# Patient Record
Sex: Female | Born: 1950 | Race: White | Hispanic: Yes | Marital: Single | Smoking: Never smoker
Health system: Northeastern US, Community
[De-identification: ages and names within clinical notes are randomized; demographics above are authoritative.]

## PROBLEM LIST (undated history)

## (undated) DIAGNOSIS — R030 Elevated blood-pressure reading, without diagnosis of hypertension: Secondary | ICD-10-CM

## (undated) DIAGNOSIS — E785 Hyperlipidemia, unspecified: Secondary | ICD-10-CM

## (undated) DIAGNOSIS — R87810 Cervical high risk human papillomavirus (HPV) DNA test positive: Secondary | ICD-10-CM

## (undated) DIAGNOSIS — H524 Presbyopia: Secondary | ICD-10-CM

## (undated) DIAGNOSIS — I059 Rheumatic mitral valve disease, unspecified: Secondary | ICD-10-CM

## (undated) DIAGNOSIS — N200 Calculus of kidney: Secondary | ICD-10-CM

## (undated) HISTORY — DX: Presbyopia: H52.4

## (undated) HISTORY — PX: BREAST REDUCTION SURGERY: SHX8

## (undated) HISTORY — DX: Rheumatic mitral valve disease, unspecified: I05.9

## (undated) HISTORY — DX: Elevated blood-pressure reading, without diagnosis of hypertension: R03.0

## (undated) HISTORY — DX: Cervical high risk human papillomavirus (HPV) DNA test positive: R87.810

## (undated) HISTORY — DX: Calculus of kidney: N20.0

## (undated) HISTORY — DX: Hyperlipidemia, unspecified: E78.5

---

## 1993-03-22 HISTORY — PX: NIPPLE/AREOLA RECONSTRUCTION: REP27

## 1998-08-19 ENCOUNTER — Ambulatory Visit (HOSPITAL_BASED_OUTPATIENT_CLINIC_OR_DEPARTMENT_OTHER): Payer: Self-pay | Admitting: Geriatric Medicine

## 1999-03-05 ENCOUNTER — Ambulatory Visit: Payer: Self-pay | Admitting: Family Medicine

## 1999-03-05 LAB — CHG CYTP SLIDES CERV/VAG MNL SCRN PHYSICIAN SUPV

## 1999-03-06 ENCOUNTER — Other Ambulatory Visit: Payer: Self-pay | Admitting: Family Medicine

## 1999-04-22 ENCOUNTER — Ambulatory Visit: Payer: Self-pay | Admitting: Family Medicine

## 1999-05-05 ENCOUNTER — Ambulatory Visit: Payer: Self-pay

## 1999-05-12 ENCOUNTER — Ambulatory Visit: Payer: Self-pay

## 1999-08-04 ENCOUNTER — Other Ambulatory Visit: Payer: Self-pay

## 1999-08-10 ENCOUNTER — Ambulatory Visit: Payer: Self-pay | Admitting: Family Medicine

## 1999-11-05 ENCOUNTER — Ambulatory Visit: Payer: Self-pay | Admitting: Family Medicine

## 1999-11-05 LAB — CHG GONADOTROPIN FOLLICLE STIMULATING HORMONE: FOLLICLE STIMULATING HORMONE: 94.37 m[IU]/mL

## 1999-11-05 LAB — THYROID SCREEN TSH REFLEX FT4: THYROID SCREEN TSH REFLEX FT4: 1.2 u[IU]/mL (ref 0.34–5.60)

## 1999-11-24 ENCOUNTER — Ambulatory Visit: Payer: Self-pay | Admitting: Family Medicine

## 1999-11-24 LAB — CHG SEDIMENTATION RATE RBC NON-AUTOMATED: RBC SEDIMENTATION RATE: 35 MM/HR — ABNORMAL HIGH (ref 0–15)

## 1999-11-24 LAB — COMPLETE BLOOD COUNT
HEMATOCRIT: 36.1 % — ABNORMAL LOW (ref 37.0–47.0)
HEMOGLOBIN: 12.8 g/dL (ref 12.0–16.0)
MEAN CORP HGB CONC: 35.5 g/dL (ref 32.0–36.0)
MEAN CORPUSCULAR HGB: 31.3 pg — ABNORMAL HIGH (ref 27.0–31.0)
MEAN CORPUSCULAR VOL: 88 fL (ref 81.0–99.0)
RBC DISTRIBUTION WIDTH: 11.8 % (ref 11.5–14.3)
RED BLOOD CELL COUNT: 4.1 MIL/uL — ABNORMAL LOW (ref 4.20–5.40)
WHITE BLOOD CELL COUNT: 9.6 10*3/uL (ref 4.8–10.8)

## 1999-11-24 LAB — HOLD RED TOP TUBE

## 1999-11-25 ENCOUNTER — Other Ambulatory Visit (HOSPITAL_BASED_OUTPATIENT_CLINIC_OR_DEPARTMENT_OTHER): Payer: Self-pay | Admitting: Family Medicine

## 1999-11-27 ENCOUNTER — Other Ambulatory Visit (HOSPITAL_BASED_OUTPATIENT_CLINIC_OR_DEPARTMENT_OTHER): Payer: Self-pay | Admitting: Family Medicine

## 2000-01-07 ENCOUNTER — Ambulatory Visit (HOSPITAL_BASED_OUTPATIENT_CLINIC_OR_DEPARTMENT_OTHER): Payer: Self-pay

## 2000-03-03 ENCOUNTER — Ambulatory Visit: Payer: Self-pay | Admitting: Family Medicine

## 2000-03-03 LAB — CHG CYTP SLIDES CERV/VAG MNL SCRN PHYSICIAN SUPV

## 2000-03-03 LAB — IADNA CHLAMYDIA TRACHOMATIS DIRECT PROBE TQ: GENPROBE CHLAMYDIA: NEGATIVE

## 2000-03-03 LAB — IADNA NEISSERIA GONORRHOEAE DIRECT PROBE TQ: GENPROBE GC: NEGATIVE

## 2000-03-04 ENCOUNTER — Other Ambulatory Visit: Payer: Self-pay | Admitting: Family Medicine

## 2000-03-04 LAB — COMPLETE BLOOD COUNT
HEMATOCRIT: 38.9 % (ref 37.0–47.0)
HEMOGLOBIN: 13.5 g/dL (ref 12.0–16.0)
MEAN CORP HGB CONC: 34.7 g/dL (ref 32.0–36.0)
MEAN CORPUSCULAR HGB: 31.1 pg — ABNORMAL HIGH (ref 27.0–31.0)
MEAN CORPUSCULAR VOL: 89.8 fL (ref 81.0–99.0)
RBC DISTRIBUTION WIDTH: 11.8 % (ref 11.5–14.3)
RED BLOOD CELL COUNT: 4.33 MIL/uL (ref 4.20–5.40)
WHITE BLOOD CELL COUNT: 7.3 10*3/uL (ref 4.8–10.8)

## 2000-03-04 LAB — CHG LIPID PANEL
Cholesterol: 270 mg/dl — ABNORMAL HIGH (ref ?–200)
HIGH DENSITY LIPOPROTEIN: 58 mg/dl (ref 35–95)
LOW DENSITY LIPOPROTEIN DIRECT: 190 mg/dl — ABNORMAL HIGH (ref ?–130)
RISK FACTOR: 4.7 — ABNORMAL HIGH (ref ?–4.4)
TRIGLYCERIDES: 119 mg/dl (ref 30–200)

## 2000-03-04 LAB — THYROID SCREEN TSH REFLEX FT4: THYROID SCREEN TSH REFLEX FT4: 1.28 u[IU]/mL (ref 0.34–5.60)

## 2000-03-04 LAB — GLUCOSE FASTING: GLUCOSE FASTING: 86 mg/dl (ref 65–110)

## 2000-05-11 ENCOUNTER — Ambulatory Visit: Payer: Self-pay | Admitting: Family Medicine

## 2000-10-14 ENCOUNTER — Ambulatory Visit: Payer: Self-pay | Admitting: Family Medicine

## 2001-03-10 ENCOUNTER — Ambulatory Visit: Payer: Self-pay | Admitting: Family Medicine

## 2001-03-10 LAB — IADNA NEISSERIA GONORRHOEAE DIRECT PROBE TQ: GENPROBE GC: NEGATIVE

## 2001-03-10 LAB — IADNA CHLAMYDIA TRACHOMATIS DIRECT PROBE TQ: GENPROBE CHLAMYDIA: NEGATIVE

## 2001-03-10 LAB — CYTOPATH, C/V, THIN LAYER

## 2001-03-10 LAB — URINE CULTURE/COLONY COUNT: URINE CULTURE/COLONY COUNT: NO GROWTH

## 2001-04-18 ENCOUNTER — Ambulatory Visit: Payer: Self-pay | Admitting: Family Medicine

## 2001-07-14 ENCOUNTER — Ambulatory Visit (HOSPITAL_BASED_OUTPATIENT_CLINIC_OR_DEPARTMENT_OTHER): Payer: Self-pay | Admitting: Gastroenterology

## 2001-07-28 ENCOUNTER — Encounter (HOSPITAL_BASED_OUTPATIENT_CLINIC_OR_DEPARTMENT_OTHER): Payer: Self-pay | Admitting: Gastroenterology

## 2001-07-28 LAB — ~~LOC~~-OPERATIVE REPORT

## 2001-08-03 ENCOUNTER — Other Ambulatory Visit: Payer: Self-pay | Admitting: Family Medicine

## 2001-08-04 LAB — MA SCREENING MAMMO BILATERAL WITH CAD

## 2001-08-18 ENCOUNTER — Ambulatory Visit (HOSPITAL_BASED_OUTPATIENT_CLINIC_OR_DEPARTMENT_OTHER): Payer: Self-pay | Admitting: Gastroenterology

## 2002-03-05 ENCOUNTER — Ambulatory Visit: Payer: Self-pay | Admitting: Family Medicine

## 2002-03-05 LAB — GLUCOSE FASTING: GLUCOSE FASTING: 81 mg/dl (ref 65–110)

## 2002-03-05 LAB — CHG LIPID PANEL
Cholesterol: 241 mg/dl — ABNORMAL HIGH (ref ?–200)
HIGH DENSITY LIPOPROTEIN: 69 mg/dl (ref 35–95)
LOW DENSITY LIPOPROTEIN DIRECT: 164 mg/dl — ABNORMAL HIGH (ref ?–130)
RISK FACTOR: 3.5 (ref ?–4.4)
TRIGLYCERIDES: 55 mg/dl (ref 30–200)

## 2002-03-05 LAB — CYTOPATH, C/V, THIN LAYER

## 2002-03-05 LAB — THYROID SCREEN TSH REFLEX FT4: THYROID SCREEN TSH REFLEX FT4: 0.68 u[IU]/mL (ref 0.34–5.60)

## 2002-03-05 LAB — HEMATOCRIT: HEMATOCRIT: 40.1 % (ref 37.0–47.0)

## 2002-04-12 ENCOUNTER — Other Ambulatory Visit (HOSPITAL_BASED_OUTPATIENT_CLINIC_OR_DEPARTMENT_OTHER): Payer: Self-pay | Admitting: Family Medicine

## 2002-04-19 ENCOUNTER — Encounter: Payer: Self-pay | Admitting: Family Medicine

## 2002-04-27 LAB — BLOOD COUNT COMPLETE AUTOMATED
HEMATOCRIT: 39.4 % (ref 37.0–47.0)
HEMOGLOBIN: 13.5 g/dL (ref 12.0–16.0)
MEAN CORP HGB CONC: 34.2 g/dL (ref 32.0–36.0)
MEAN CORPUSCULAR HGB: 30 pg (ref 27.0–31.0)
MEAN CORPUSCULAR VOL: 87.7 fL (ref 81.0–99.0)
MEAN PLATELET VOLUME: 9.4 fL (ref 6.4–10.8)
PLATELET COUNT: 252 10*3/uL (ref 150–400)
RBC DISTRIBUTION WIDTH: 12.2 % (ref 11.5–14.3)
RED BLOOD CELL COUNT: 4.49 MIL/uL (ref 4.20–5.40)
WHITE BLOOD CELL COUNT: 8.3 10*3/uL (ref 4.8–10.8)

## 2002-04-27 LAB — PROTHROMBIN TIME
INR: 0.9 — ABNORMAL LOW (ref 2.0–3.5)
PROTHROMBIN TIME: 11.8 SECONDS (ref 11.5–13.5)

## 2002-04-27 LAB — APTT: APTT: 28 SECONDS (ref 22.0–32.8)

## 2002-04-28 LAB — BASIC METABOLIC PANEL
BUN (UREA NITROGEN): 14 mg/dl (ref 10–20)
CALCIUM: 8.7 mg/dl (ref 8.5–10.5)
CARBON DIOXIDE: 29 mEQ/L (ref 22–32)
CHLORIDE: 106 mEQ/L (ref 98–110)
CREATININE: 0.9 mg/dl (ref 0.8–1.2)
Glucose Random: 90 mg/dl (ref 65–160)
POTASSIUM: 4.6 mEQ/L (ref 3.5–5.0)
SODIUM: 138 mEQ/L (ref 135–145)

## 2002-04-28 LAB — CHG HEPATIC FUNCTION PANEL
ALANINE AMINOTRANSFERASE: 28 IU/L (ref 3–36)
ALBUMIN: 3.7 g/dl (ref 3.5–5.0)
ALKALINE PHOSPHATASE: 55 IU/L (ref 50–136)
ASPARTATE AMINOTRANSFERASE: 34 U/L — ABNORMAL HIGH (ref 7–29)
BILIRUBIN DIRECT: 0.1 mg/dl (ref 0–0.36)
BILIRUBIN TOTAL: 0.4 mg/dl (ref 0.15–1.0)
TOTAL PROTEIN: 7.3 g/dl (ref 6.2–8.5)

## 2002-04-28 LAB — CHG LIPID PANEL
Cholesterol: 271 mg/dl — ABNORMAL HIGH (ref ?–200)
HIGH DENSITY LIPOPROTEIN: 46 mg/dl (ref 35–95)
LOW DENSITY LIPOPROTEIN DIRECT: 183 mg/dl — ABNORMAL HIGH (ref ?–130)
RISK FACTOR: 5.9 — ABNORMAL HIGH (ref ?–4.4)
TRIGLYCERIDES: 280 mg/dl — ABNORMAL HIGH (ref 30–200)

## 2002-04-28 LAB — BLOOD COUNT COMPLETE AUTOMATED
HEMATOCRIT: 41.4 % (ref 37.0–47.0)
HEMOGLOBIN: 14.3 g/dL (ref 12.0–16.0)
MEAN CORP HGB CONC: 34.6 g/dL (ref 32.0–36.0)
MEAN CORPUSCULAR HGB: 30.7 pg (ref 27.0–31.0)
MEAN CORPUSCULAR VOL: 88.6 fL (ref 81.0–99.0)
MEAN PLATELET VOLUME: 9.1 fL (ref 8.0–12.0)
PLATELET COUNT: 176 10*3/uL (ref 150–400)
RBC DISTRIBUTION WIDTH: 12.4 % (ref 11.5–14.3)
RED BLOOD CELL COUNT: 4.67 MIL/uL (ref 4.20–5.40)
WHITE BLOOD CELL COUNT: 2.5 10*3/uL — ABNORMAL LOW (ref 4.8–10.8)

## 2002-04-28 LAB — DIFFERENTIAL WBC COUNT
ATYPICAL LYMPHOCYTES %: 10 % (ref 0–12)
BAND NEUTROPHILS %: 3 % (ref 0–8)
LYMPHOCYTES %: 39 % (ref 12–40)
MONOCYTES %: 8 % (ref 1–11)
PLATELET ESTIMATE: NORMAL
POLYMORPHONUCLEAR (SEGS) %: 40 % — ABNORMAL LOW (ref 41–77)
RED BLOOD CELL MORPHOLOGY: NORMAL

## 2002-04-28 LAB — COMPLETE BLOOD COUNT
HEMATOCRIT: 38.9 % (ref 37.0–47.0)
HEMOGLOBIN: 13.2 g/dL (ref 12.0–16.0)
MEAN CORP HGB CONC: 34.1 g/dL (ref 32.0–36.0)
MEAN CORPUSCULAR HGB: 29.9 pg (ref 27.0–31.0)
MEAN CORPUSCULAR VOL: 87.8 fL (ref 81.0–99.0)
RBC DISTRIBUTION WIDTH: 12.2 % (ref 11.5–14.3)
RED BLOOD CELL COUNT: 4.42 MIL/uL (ref 4.20–5.40)
WHITE BLOOD CELL COUNT: 7.2 10*3/uL (ref 4.8–10.8)

## 2002-04-28 LAB — GLUCOSE FASTING: GLUCOSE FASTING: 88 mg/dl (ref 65–110)

## 2002-04-28 LAB — CHG SEDIMENTATION RATE RBC NON-AUTOMATED: RBC SEDIMENTATION RATE: 30 MM/HR — ABNORMAL HIGH (ref 0–15)

## 2002-04-28 LAB — CHG GONADOTROPIN FOLLICLE STIMULATING HORMONE: FOLLICLE STIMULATING HORMONE: 120.7 m[IU]/mL

## 2002-04-28 LAB — RPR: RPR: NONREACTIVE

## 2002-04-28 LAB — HOLD RED TOP TUBE

## 2002-05-04 ENCOUNTER — Encounter (HOSPITAL_BASED_OUTPATIENT_CLINIC_OR_DEPARTMENT_OTHER): Payer: Self-pay | Admitting: Dermatology

## 2002-05-14 LAB — CARDIAC STRESS TEST: EXERCISE/TREADMILL

## 2002-07-19 ENCOUNTER — Ambulatory Visit: Payer: Self-pay | Admitting: Family Medicine

## 2002-10-03 ENCOUNTER — Encounter (HOSPITAL_BASED_OUTPATIENT_CLINIC_OR_DEPARTMENT_OTHER): Payer: No Typology Code available for payment source | Admitting: Dermatology

## 2003-04-15 ENCOUNTER — Ambulatory Visit (HOSPITAL_BASED_OUTPATIENT_CLINIC_OR_DEPARTMENT_OTHER): Payer: Self-pay | Admitting: Occupational Medicine

## 2003-04-17 ENCOUNTER — Ambulatory Visit (HOSPITAL_BASED_OUTPATIENT_CLINIC_OR_DEPARTMENT_OTHER): Payer: Self-pay

## 2003-04-22 LAB — HEPATITIS B SURFACE ANTIBODY: HEPATITIS B SURFACE ANTIBODY: NEGATIVE

## 2004-03-27 ENCOUNTER — Ambulatory Visit (HOSPITAL_BASED_OUTPATIENT_CLINIC_OR_DEPARTMENT_OTHER): Payer: Self-pay

## 2004-04-08 ENCOUNTER — Ambulatory Visit (HOSPITAL_BASED_OUTPATIENT_CLINIC_OR_DEPARTMENT_OTHER): Payer: Self-pay | Admitting: Internal Medicine

## 2004-04-27 ENCOUNTER — Ambulatory Visit (HOSPITAL_BASED_OUTPATIENT_CLINIC_OR_DEPARTMENT_OTHER): Payer: Self-pay

## 2004-04-27 DIAGNOSIS — Z23 Encounter for immunization: Principal | ICD-10-CM

## 2004-06-18 ENCOUNTER — Ambulatory Visit (HOSPITAL_BASED_OUTPATIENT_CLINIC_OR_DEPARTMENT_OTHER): Payer: Charity | Admitting: Dermatology

## 2004-06-30 ENCOUNTER — Other Ambulatory Visit (HOSPITAL_BASED_OUTPATIENT_CLINIC_OR_DEPARTMENT_OTHER): Payer: Charity | Admitting: Lab

## 2004-07-01 ENCOUNTER — Ambulatory Visit (HOSPITAL_BASED_OUTPATIENT_CLINIC_OR_DEPARTMENT_OTHER): Payer: Self-pay

## 2004-07-01 ENCOUNTER — Ambulatory Visit (HOSPITAL_BASED_OUTPATIENT_CLINIC_OR_DEPARTMENT_OTHER): Payer: Charity

## 2004-07-01 DIAGNOSIS — Z111 Encounter for screening for respiratory tuberculosis: Secondary | ICD-10-CM

## 2004-07-01 DIAGNOSIS — Z139 Encounter for screening, unspecified: Principal | ICD-10-CM

## 2004-07-01 NOTE — Nursing Note (Signed)
>>   GESING, SUSAN     07/01/2004   5:09 pm  PPD 0.78ml ID lt forearm planted and pt agrees to rtc in 2 days for a PPD check.She also needs titers drawn for MMR and Varicella.Appt scheduled with dr. Tyler Deis on 08/10/04

## 2004-07-02 LAB — RUBEOLA IGG ANTIBODY: RUBEOLA IGG ANTIBODY: 1.8 — ABNORMAL HIGH (ref 0.0–0.8)

## 2004-07-02 LAB — MUMPS IGG ANTIBODY: MUMPS IGG ANTIBODY: 3.1 — ABNORMAL HIGH (ref 0.0–0.8)

## 2004-07-02 LAB — VARICELLA ZOSTER IGG ANTIBODY: VARICELLA ZOSTER IGG ANTIBODY: >3.2 — ABNORMAL HIGH (ref 0.0–0.8)

## 2004-07-02 LAB — RUBELLA IGG ANTIBODY: RUBELLA: IMMUNE

## 2004-07-03 ENCOUNTER — Ambulatory Visit (HOSPITAL_BASED_OUTPATIENT_CLINIC_OR_DEPARTMENT_OTHER): Payer: Charity

## 2004-07-03 ENCOUNTER — Ambulatory Visit (HOSPITAL_BASED_OUTPATIENT_CLINIC_OR_DEPARTMENT_OTHER): Payer: Self-pay

## 2004-07-03 DIAGNOSIS — Z23 Encounter for immunization: Principal | ICD-10-CM

## 2004-07-03 LAB — SKIN TEST TUBERCULOSIS INTRADERMAL: PPD: NEGATIVE

## 2004-07-03 NOTE — Nursing Note (Signed)
>>   MATOS, [facility]     07/03/2004   7:03 pm  Pt here for ppd reading . Reading was normal no inderation. Also needed td. Td given Vis sheet given . Titer results given.

## 2004-08-10 ENCOUNTER — Encounter (HOSPITAL_BASED_OUTPATIENT_CLINIC_OR_DEPARTMENT_OTHER): Payer: Self-pay | Admitting: Family Medicine

## 2004-08-10 ENCOUNTER — Ambulatory Visit (HOSPITAL_BASED_OUTPATIENT_CLINIC_OR_DEPARTMENT_OTHER): Payer: Charity | Admitting: Family Medicine

## 2004-08-10 ENCOUNTER — Ambulatory Visit (HOSPITAL_BASED_OUTPATIENT_CLINIC_OR_DEPARTMENT_OTHER): Payer: Self-pay | Admitting: Family Medicine

## 2004-08-10 VITALS — BP 130/78 | HR 78 | Temp 98.0°F | Resp 18 | Ht 62.5 in | Wt 114.0 lb

## 2004-08-10 DIAGNOSIS — E785 Hyperlipidemia, unspecified: Secondary | ICD-10-CM

## 2004-08-10 DIAGNOSIS — I059 Rheumatic mitral valve disease, unspecified: Secondary | ICD-10-CM | POA: Insufficient documentation

## 2004-08-10 DIAGNOSIS — Z0000 Encounter for general adult medical examination without abnormal findings: Principal | ICD-10-CM

## 2004-08-10 DIAGNOSIS — N200 Calculus of kidney: Secondary | ICD-10-CM | POA: Insufficient documentation

## 2004-08-10 DIAGNOSIS — E7800 Pure hypercholesterolemia, unspecified: Secondary | ICD-10-CM | POA: Insufficient documentation

## 2004-08-10 DIAGNOSIS — N952 Postmenopausal atrophic vaginitis: Secondary | ICD-10-CM

## 2004-08-10 HISTORY — DX: Rheumatic mitral valve disease, unspecified: I05.9

## 2004-08-10 HISTORY — DX: Hyperlipidemia, unspecified: E78.5

## 2004-08-10 HISTORY — DX: Calculus of kidney: N20.0

## 2004-08-10 LAB — CYTOPATH, C/V, THIN LAYER

## 2004-08-10 MED ORDER — CVS ENTERIC ASPIRIN 81 MG OR TBEC
DELAYED_RELEASE_TABLET | ORAL | Status: DC
Start: 2004-08-10 — End: 2006-07-08

## 2004-08-10 MED ORDER — ESTRACE 0.1 MG/GM VA CREA
TOPICAL_CREAM | VAGINAL | Status: DC
Start: 2004-08-10 — End: 2004-08-10

## 2004-08-10 MED ORDER — ESTROGENS, CONJUGATED 0.625 MG/GM VA CREA
TOPICAL_CREAM | VAGINAL | Status: DC
Start: 2004-08-10 — End: 2004-11-02

## 2004-08-10 NOTE — Progress Notes (Signed)
Chief Complaint:  Linda Rodriguez is a 54 year old female who presents for a physical exam. Patient also has other current concerns as detailed below.  I have reviewed the patient's medical history in detail and updated the computerized patient record.  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    Allergies: Propoxyphene, Diclofenac Potassium    Immunization History   Hep B,Adult dose 03/27/2004 04/27/2004    PPD 07/01/2004    TD 03/23/1994 07/03/2004   Past Medical History:   menopause 2001 08/10/2004    MITRAL VALVE DISORDER 08/10/2004    Comment: History of rheumatic fever Echo 9/01 mild    mitral regurgitation   CALCULUS OF KIDNEY 08/10/2004    HYPERLIPIDEMIA NEC/NOS 08/10/2004    Comment: LDL 194  Past Surgical History:   BREAST RECONSTRUCTION 1995    Comment: breast reduction  Social History   Marital Status: Single Spouse Name:    Years of Education: Number of children:     Occupational History  Occupation Clinical cytogeneticist UNEMPLOYED     Social History Main Topics   Tobacco Use: Never    Alcohol Use: Yes 1 oz/week   Drug Use: No    Sexual Activity: Yes Partners with: Female    Other Topics Concern  MILITARY No  BLOOD TRANSFUSIONS No  CAFFEINE No  OCCUPATIONAL EXPOSURE No  SLEEP CONCERN No  STRESS CONCERN No  WEIGHT CONCERN No  DIET No  EXERCISE Not Asked   Comment: planning to start  SEAT BELT Yes  SELF EXAMS Yes       Review of patient's family history indicates:   Heart Father    Comment: MI age 44   Osteoporosis Mother    Asthma Sister    Asthma Brother    Diabetes Brother    Tree surgeon Abuse Brother    Cataracts Father       Review of Systems:   Skin: negative  Eyes: negative  Ears/Nose/Throat: negative  Respiratory: occasional pain with breath, fleeting  Cardiovascular: negative  Gastrointestinal: negative  Genitourinary: see  below  Gyn:see below  no breast pain or new or enlarging lumps on self exam  no vaginal bleeding  no discharge or pelvic pain  no hot flashes  no abnormal bleeding, pelvic pain or discharge,no breast pain or new or enlarging lumps on self exam    Musculoskeletal: negative  Neurologic: negative  Endocrine: negative  Psychiatric: negative  Hematologic/Lymphatic/Immunologic: negative    Physical:  BP 130/78   Pulse 78   Temp 98   Resp 18   Ht 5' 2.5" (1.66m)   Wt 114 lbs (51.7kg)   LMP Postmenopausal  Body mass index is 20.51 kg/(m^2).  General appearance: healthy, alert, well developed, well nourished  Eyes: negative, PERLA, EOM's full  Skin: skin color, texture, turgor are normal  Head: Normocephalic. No masses, lesions, tenderness or abnormalities  Ears: External ears normal. Canals clear. TM's normal.  Nose/Sinuses: negative  Oropharynx: Lips, mucosa, and tongue normal. Teeth and gums normal. Oropharynx moist and without lesion  Neck: supple. No adenopathy. Thyroid symmetric, normal size, and without nodularity  Back: symmetric, no curvature.   Lungs: chest symmetric with normal A/P diameter, no chest deformities noted, normal respiratory rate and rhythm, lungs clear to auscultation. Slight costochondral tenderness left  Heart:  regular rate and rhythm, S1 normal, S2 normal, no murmurs, clicks, or gallops  Breast: symmetric, no dominant or suspicious mass, no skin or nipple changes, no axillary adenopathy.   Breast self exam is taught and encouraged  Abdomen: soft, non-tender. No masses, no organomegaly  Extremities: normal. No deformities, edema, or skin discoloration  Musculoskeletal: Muscular strength intact.  Neuro: mental status intact, cranial nerves 2-12 intact, muscle tone normal, normal gait   Pelvic: external genitalia normal, vaginal mucosa atrophic cervix clear, normal sized uterus, adnexae negative  Rectal: normal Hemoccult negative    Health Counseling:  Smoking: Reviewed and Discussed  Substance Use  Issues: Reviewed and Discussed   Seat Belts: Reviewed and Discussed  Diet, Exercise, Wt. Control: Reviewed and Discussed  Dental Health: Reviewed and Discussed  Vision Health: Reviewed and Discussed  Mental Health: Reviewed and Discussed  Domestic Violence: Reviewed and Discussed  Sexual Health: Reviewed and Discussed  BSE: Reviewed and Discussed  Osteoporosis: Reviewed and Discussed  Pap screening done today  Mammogram scheduled  Colorectal cancer screening up to date      ASSESSMENT/PLAN:    V70.0 ROUTINE MEDICAL EXAM (primary encounter diagnosis)  Note:     Plan: HIV 1 AND 2 PLUS O ANTIBODY, COMPLETE CBC,    AUTOMATED, ROUTINE VENIPUNCTURE, N.GONORRHOEAE,   DNA, DIR PROB, CHYLMD TRACH, DNA, DIR PROBE       *272.4 HYPERLIPIDEMIA NEC/NOS  Note: LIPIDS DONE IN [place] 2003  LDL 130  HDL 57  TG 327  Meds prescribed in [place] have been added to list; note that she is taking atorvastatin qod and fibrate 250 qod (this dose apparently not available in [place])  Plan: LIPID PANEL, GLUCOSE FASTING       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 Pain with penetration at the beginning of intercourse, poor lubrication  Plan: CONJUGATED 0.625 MG/GM VA CREA   Ed re: endometrial cancer risk with unopposed estrogens, need to find smallest effective dose, follow up n 2 months to discuss progestins    History of possible TIA  SUBJECTIVE:  She stopped ASA due to gastric symptoms  No subsequent neuro Sx  Carotid exam today normal  PLAN:  Try ECASA

## 2004-08-11 LAB — IADNA NEISSERIA GONORRHOEAE DIRECT PROBE TQ: GENPROBE GC: NEGATIVE

## 2004-08-11 LAB — IADNA CHLAMYDIA TRACHOMATIS DIRECT PROBE TQ: GENPROBE CHLAMYDIA: NEGATIVE

## 2004-08-12 ENCOUNTER — Other Ambulatory Visit (HOSPITAL_BASED_OUTPATIENT_CLINIC_OR_DEPARTMENT_OTHER): Payer: Charity | Admitting: Lab

## 2004-08-12 DIAGNOSIS — Z0000 Encounter for general adult medical examination without abnormal findings: Principal | ICD-10-CM

## 2004-08-12 DIAGNOSIS — E785 Hyperlipidemia, unspecified: Secondary | ICD-10-CM

## 2004-08-12 LAB — BLOOD COUNT COMPLETE AUTOMATED
HEMATOCRIT: 38.6 % (ref 37.0–47.0)
HEMOGLOBIN: 12.5 g/dL (ref 12.0–16.0)
MEAN CORP HGB CONC: 32.5 g/dL (ref 32.0–36.0)
MEAN CORPUSCULAR HGB: 29.1 pg (ref 27.0–31.0)
MEAN CORPUSCULAR VOL: 89.4 fL (ref 81.0–99.0)
MEAN PLATELET VOLUME: 9.5 fL (ref 6.4–10.8)
PLATELET COUNT: 309 10*3/uL (ref 150–400)
RBC DISTRIBUTION WIDTH: 13.2 % (ref 11.5–14.3)
RED BLOOD CELL COUNT: 4.31 MIL/uL (ref 4.20–5.40)
WHITE BLOOD CELL COUNT: 7.1 10*3/uL (ref 4.8–10.8)

## 2004-08-12 NOTE — Nursing Note (Signed)
>>   Governor Rooks     08/13/2004   11:09 am  Specimens drawn as ordered

## 2004-08-13 LAB — GLUCOSE FASTING: GLUCOSE FASTING: 87 mg/dl (ref 74–118)

## 2004-08-13 LAB — CHG LIPID PANEL
Cholesterol: 201 mg/dl — ABNORMAL HIGH (ref 0–200)
HIGH DENSITY LIPOPROTEIN: 57 mg/dl (ref 35–85)
LOW DENSITY LIPOPROTEIN DIRECT: 125 mg/dl — ABNORMAL HIGH (ref 0–100)
RISK FACTOR: 3.5 (ref ?–4.4)
TRIGLYCERIDES: 152 mg/dl — ABNORMAL HIGH (ref 0–150)

## 2004-08-13 LAB — HIV 1 AND 2 PLUS O ANTIBODY: HIV 1 AND 2 PLUS O SCREEN: NONREACTIVE

## 2004-08-23 ENCOUNTER — Encounter (HOSPITAL_BASED_OUTPATIENT_CLINIC_OR_DEPARTMENT_OTHER): Payer: Self-pay | Admitting: Family Medicine

## 2004-08-31 ENCOUNTER — Encounter (HOSPITAL_BASED_OUTPATIENT_CLINIC_OR_DEPARTMENT_OTHER): Payer: Self-pay | Admitting: Family Medicine

## 2004-09-09 ENCOUNTER — Other Ambulatory Visit: Payer: Self-pay | Admitting: Family Medicine

## 2004-09-09 DIAGNOSIS — Z1231 Encounter for screening mammogram for malignant neoplasm of breast: Principal | ICD-10-CM

## 2004-09-09 LAB — MA SCREENING MAMMO BILATERAL WITH CAD

## 2004-09-11 ENCOUNTER — Encounter (HOSPITAL_BASED_OUTPATIENT_CLINIC_OR_DEPARTMENT_OTHER): Payer: Self-pay

## 2004-09-24 ENCOUNTER — Ambulatory Visit (HOSPITAL_BASED_OUTPATIENT_CLINIC_OR_DEPARTMENT_OTHER): Payer: Self-pay

## 2004-10-26 ENCOUNTER — Ambulatory Visit (HOSPITAL_BASED_OUTPATIENT_CLINIC_OR_DEPARTMENT_OTHER): Payer: Self-pay

## 2004-10-26 DIAGNOSIS — Z23 Encounter for immunization: Principal | ICD-10-CM

## 2004-11-02 ENCOUNTER — Ambulatory Visit (HOSPITAL_BASED_OUTPATIENT_CLINIC_OR_DEPARTMENT_OTHER): Payer: Charity | Admitting: Family Medicine

## 2004-11-02 VITALS — BP 120/70 | HR 78 | Temp 97.5°F | Resp 18 | Wt 115.0 lb

## 2004-11-02 DIAGNOSIS — N952 Postmenopausal atrophic vaginitis: Principal | ICD-10-CM

## 2004-11-02 MED ORDER — ESTROGENS, CONJUGATED 0.625 MG/GM VA CREA
TOPICAL_CREAM | VAGINAL | Status: DC
Start: 2004-11-02 — End: 2006-07-08

## 2004-11-02 MED ORDER — MEDROXYPROGESTERONE ACETATE 2.5 MG PO TABS
ORAL_TABLET | ORAL | Status: DC
Start: 2004-11-02 — End: 2006-07-08

## 2004-11-04 NOTE — Progress Notes (Signed)
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 She has both increased desire and resolution of dyspareunia. No vaginal bleeding. Occasional itch. However, has been using 1.5 grams daily as when she went down to 1 gm daily symptoms seemed to recur.  OBJECTIVE:  BP 120/70   Pulse 78   Temp 97.5   Resp 18   Wt 115 lbs (52.2kg)   LMP Postmenopausal  Pap and mammogram were normal.  LDL is improved at 125 on Lipitor.    Plan: ESTROGENS, CONJUGATED 0.625 MG/GM VA CREA,    MEDROXYPROGESTERONE ACETATE 2.5 MG OR TABS   We discussed the risks of endometrial cancer with unopposed estrogens and added the Provera. I have encouraged her not to exceed the 1 gram daily dose and to decrease if she can. Explained that at higher doses enough is absorbed systemically to carry similar risks to oral estrogens. She will need to continue with annual Paps and mammograms.

## 2004-11-05 ENCOUNTER — Telehealth (HOSPITAL_BASED_OUTPATIENT_CLINIC_OR_DEPARTMENT_OTHER): Payer: Self-pay

## 2004-12-07 ENCOUNTER — Ambulatory Visit (HOSPITAL_BASED_OUTPATIENT_CLINIC_OR_DEPARTMENT_OTHER): Payer: Self-pay

## 2004-12-07 DIAGNOSIS — Z23 Encounter for immunization: Principal | ICD-10-CM

## 2004-12-09 LAB — HEPATITIS B SURFACE ANTIBODY: HEPATITIS B SURFACE ANTIBODY: POSITIVE

## 2004-12-31 ENCOUNTER — Telehealth (HOSPITAL_BASED_OUTPATIENT_CLINIC_OR_DEPARTMENT_OTHER): Payer: Self-pay | Admitting: Family Medicine

## 2005-01-05 ENCOUNTER — Telehealth (HOSPITAL_BASED_OUTPATIENT_CLINIC_OR_DEPARTMENT_OTHER): Payer: Self-pay | Admitting: Family Medicine

## 2005-03-01 ENCOUNTER — Ambulatory Visit (HOSPITAL_BASED_OUTPATIENT_CLINIC_OR_DEPARTMENT_OTHER): Payer: Self-pay | Admitting: Family Medicine

## 2005-04-02 ENCOUNTER — Encounter (HOSPITAL_BASED_OUTPATIENT_CLINIC_OR_DEPARTMENT_OTHER): Payer: Charity | Admitting: Dermatology

## 2005-04-02 DIAGNOSIS — L570 Actinic keratosis: Principal | ICD-10-CM

## 2005-04-02 DIAGNOSIS — L738 Other specified follicular disorders: Secondary | ICD-10-CM

## 2005-04-02 DIAGNOSIS — L821 Other seborrheic keratosis: Secondary | ICD-10-CM

## 2005-04-02 DIAGNOSIS — L659 Nonscarring hair loss, unspecified: Secondary | ICD-10-CM

## 2005-04-20 ENCOUNTER — Other Ambulatory Visit (HOSPITAL_BASED_OUTPATIENT_CLINIC_OR_DEPARTMENT_OTHER): Payer: Charity | Admitting: Otolaryngology

## 2005-04-20 DIAGNOSIS — R42 Dizziness and giddiness: Secondary | ICD-10-CM

## 2005-04-20 DIAGNOSIS — H905 Unspecified sensorineural hearing loss: Principal | ICD-10-CM

## 2005-04-23 LAB — PROBLEM LIST

## 2005-04-23 LAB — SURG SPEC CLINIC NOTE

## 2005-04-28 ENCOUNTER — Other Ambulatory Visit: Payer: Self-pay | Admitting: Otolaryngology

## 2005-04-28 DIAGNOSIS — R930 Abnormal findings on diagnostic imaging of skull and head, not elsewhere classified: Secondary | ICD-10-CM

## 2005-04-28 DIAGNOSIS — R42 Dizziness and giddiness: Principal | ICD-10-CM

## 2005-04-28 LAB — MRI INTERNAL AUDITORY CANAL W & WO CONTRAST

## 2005-05-05 ENCOUNTER — Ambulatory Visit (HOSPITAL_BASED_OUTPATIENT_CLINIC_OR_DEPARTMENT_OTHER): Payer: Charity | Admitting: Family Medicine

## 2005-05-07 ENCOUNTER — Ambulatory Visit (HOSPITAL_BASED_OUTPATIENT_CLINIC_OR_DEPARTMENT_OTHER): Payer: Charity | Admitting: Otolaryngology

## 2005-05-07 DIAGNOSIS — R42 Dizziness and giddiness: Principal | ICD-10-CM

## 2005-05-08 LAB — SURG SPEC CLINIC NOTE

## 2005-05-10 ENCOUNTER — Encounter (HOSPITAL_BASED_OUTPATIENT_CLINIC_OR_DEPARTMENT_OTHER): Payer: Self-pay | Admitting: Family Medicine

## 2005-05-10 DIAGNOSIS — R42 Dizziness and giddiness: Secondary | ICD-10-CM | POA: Insufficient documentation

## 2005-05-24 ENCOUNTER — Encounter (HOSPITAL_BASED_OUTPATIENT_CLINIC_OR_DEPARTMENT_OTHER): Payer: Self-pay | Admitting: Neurology

## 2005-06-02 ENCOUNTER — Ambulatory Visit (HOSPITAL_BASED_OUTPATIENT_CLINIC_OR_DEPARTMENT_OTHER): Payer: Charity | Admitting: Family Medicine

## 2005-06-02 VITALS — BP 108/80 | HR 72 | Temp 98.1°F | Wt 114.0 lb

## 2005-06-02 DIAGNOSIS — K3189 Other diseases of stomach and duodenum: Secondary | ICD-10-CM

## 2005-06-02 DIAGNOSIS — R42 Dizziness and giddiness: Principal | ICD-10-CM

## 2005-06-02 DIAGNOSIS — R1013 Epigastric pain: Secondary | ICD-10-CM

## 2005-06-02 MED ORDER — RANITIDINE HCL 150 MG PO TABS
ORAL_TABLET | ORAL | Status: AC
Start: 2005-06-02 — End: 2005-09-02

## 2005-06-02 NOTE — Progress Notes (Signed)
780.4 DIZZINESS AND GIDDINESS (primary encounter diagnosis)  SUBJECTIVE:  She has been involved n an extensive workup for vertigo over the past couple of months, initiated elsewhere. Eye exam was normal; referred to ENT (Dr. Addison Naegeli). MRI to r/o acoustic neuroma ordered by Dr. Addison Naegeli showed a finding in the right caudate nucleus and he referred her to neurology; has appointment next week with Dr. Bradly Bienenstock. She brings in a printout of her MRI result about which she is quite concerned. Meanwhile, dizziness has resolved.    Of note, she had an episode in 2001 in which I was quite concerned about a possible TIA. Evaluation at that time included an MRI with a similar report to the current report as well as MRA showing no carotid disease and some asymmetry of the vertebral arteries, thought to be a normal variant. Echo showed mild mitral regurgitation.    In the interval years, she has not had neurologic symptoms.    OBJECTIVE:  Looks well, NAD  BP 108/80  Pulse 72  Temp (Src) 98.1 (Oral)  Wt 114 lbs (51.7kg)  LMP Postmenopausal  Gait normal. Detailed neuro exam deferred.    Plan: I have reviewed the MRI reports with her and have asked her ot obtain the actual films of the prior MRI to bring to her appointment with Dr. Bradly Bienenstock. WE have also discussed her desire to be on post menopausal estrogens in light of a possible small stroke in the past. I have discouraged her from this in the past but did prescribe estrogen vaginal cream. Tonight I have explained ot her that this IS absorbed systemically and is only less likely to have adverse effects if she uses a minimal amount--when prescribed she was using more than I had recommended.    536.8 STOMACH FUNCTION DIS NEC  SUBJECTIVE: Several times per week gets heartburn, relieved by TUMS or a pill she was given in [place]. Not clear if certain foods are problematic.    OBJECTIVE:  The abdomen is soft without tenderness, guarding, mass, rebound or organomegaly.     Plan: HELICOBACTER  PYLORI STOOL AG (INHOUSE)   PRN ranitidine

## 2005-06-11 ENCOUNTER — Encounter (HOSPITAL_BASED_OUTPATIENT_CLINIC_OR_DEPARTMENT_OTHER): Payer: Charity | Admitting: Neurology

## 2005-06-11 DIAGNOSIS — R42 Dizziness and giddiness: Principal | ICD-10-CM

## 2005-06-11 DIAGNOSIS — I69998 Other sequelae following unspecified cerebrovascular disease: Secondary | ICD-10-CM

## 2005-06-14 ENCOUNTER — Other Ambulatory Visit (HOSPITAL_BASED_OUTPATIENT_CLINIC_OR_DEPARTMENT_OTHER): Payer: Charity | Admitting: Ophthalmology

## 2005-06-14 DIAGNOSIS — H524 Presbyopia: Principal | ICD-10-CM

## 2005-06-15 LAB — MED SPEC CLINIC NOTE

## 2005-06-16 ENCOUNTER — Telehealth (HOSPITAL_BASED_OUTPATIENT_CLINIC_OR_DEPARTMENT_OTHER): Payer: Self-pay

## 2005-06-16 NOTE — Telephone Encounter (Signed)
TC to pt who states that she is having lt breast pain with mild inflammation in the area. SV given for tomorrow AM with Dr. Noel Gerold.

## 2005-06-16 NOTE — Telephone Encounter (Signed)
Staff Message copied by Eliseo Gum on 06/16/2005 at 4:48 PM  ------   Message from: [place] Ouch   Created: 06/16/2005 at 3:39 PM   Regarding: Question    Linda Rodriguez [id number], 55 year old, female, [phone] (home) [phone] (work)    Cleotis Lema NUMBER: [phone]  Cell phone: [phone]  Other phone:    Available times:    Patient's language of care: English    Patient does not need an interpreter.    Patient's PCP: Tonette Lederer MD, MD    Person calling on behalf of patient: patient (self)    Calls today with questions and concerns.pt wants to speak with the nuse because she is been having pain in her left breast.

## 2005-06-17 ENCOUNTER — Ambulatory Visit (HOSPITAL_BASED_OUTPATIENT_CLINIC_OR_DEPARTMENT_OTHER): Payer: Charity | Admitting: Family Medicine

## 2005-06-17 VITALS — BP 120/70 | HR 60 | Temp 97.5°F | Resp 16 | Ht 61.0 in | Wt 117.0 lb

## 2005-06-17 DIAGNOSIS — N644 Mastodynia: Principal | ICD-10-CM

## 2005-06-17 NOTE — Progress Notes (Signed)
SUBJECTIVE: Linda Rodriguez is a 55 year old female here with left lateral breast pain. Started shortly after last visit with Dr. Tyler Deis ( couple of weeks ago). Has been intermittent, and waxing and waning in severity. At some points even her bra hurts her. Overall is quite a bet better today, but still has tenderness. Before had pain even with lifting her arms. No fevers. Postmenopausal x 7 years.    S/p breast reduction 1995.    PMH: Had some inflammation in that breast in her 44s, resolved with NSAIDS.    OBJECTIVE:  Generally well-appearing, alert, appropriate, NAD.  BP 120/70   Pulse 60   Temp (Src) 97.5 (Oral)   Resp 16   Ht 5\' 1"  (1.21m)   Wt 117 lbs (53.1kg)   LMP Postmenopausal  Breasts: Symmetric, scars from breast reduction surgery visible, no warmth, redness or other skin changes. Generally dense breast tissue bilaterally. Mild tenderness in the lateral aspect of the left breast. No discrete lumps or masses palpable. No axillary adenopathy.    ASSESSMENT AND PLAN:    611.71 MASTODYNIA (primary encounter diagnosis)  Note: Improving. Unclear etiology. Normal breast exam.  Plan: CPE is due in 2 months, and mammogram shortly after. If breast pain continues to improve, can wait until CPE with Dr. Tyler Deis and have annual mammogram, but if pain does not resolve over thenext two weeks, she is to call back, and I will arrange diagnostic mammogram and ultrasound.

## 2005-06-17 NOTE — Patient Instructions (Signed)
CPE/pap with Dr. Tyler Deis in 2 months.

## 2005-06-28 ENCOUNTER — Telehealth (HOSPITAL_BASED_OUTPATIENT_CLINIC_OR_DEPARTMENT_OTHER): Payer: Self-pay

## 2005-06-28 NOTE — Telephone Encounter (Signed)
Staff Message copied by Eliseo Gum on 06/28/2005 at 2:49 PM  ------   Message from: Mardelle Matte   Created: 06/28/2005 at 1:59 PM   Regarding: pain on left breast   Contact: [phone]    Linda Rodriguez [id number], 55 year old, female, [phone] (home) [phone] (work)    Cleotis Lema NUMBER: [phone]  Cell phone:   Other phone:    Available times:anytime     Patient's language of care: English    Patient does not need an interpreter.    Patient's PCP: Tonette Lederer MD, MD    Person calling on behalf of patient: patient (self)    Calls today with a sick call. pain on left breast, took Motrin, did help her with the pain but she wants to know how long she should be taking Motrin and Tylenol.

## 2005-06-28 NOTE — Telephone Encounter (Signed)
TC to pt at the contact number listed ,no answer and it's a man's voice on the message. TC to pt at home phone, no answer, lm to call me.

## 2005-06-29 NOTE — Telephone Encounter (Signed)
Staff Message copied by Eliseo Gum on 06/29/2005 at 3:22 PM  ------   Message from: [place] Ouch   Created: 06/29/2005 at 2:48 PM   Regarding: Return call   Contact: [phone]    Linda Rodriguez [id number], 55 year old, female, [phone] (home) [phone] (work)    Cleotis Lema NUMBER: [phone]  Cell phone:   Other phone:    Available times:    Patient's language of care: English    Patient does not need an interpreter.    Patient's PCP: Tonette Lederer MD, MD    Person calling on behalf of patient: patient (self)    Calls today pt returning your call from yesterday.

## 2005-06-29 NOTE — Telephone Encounter (Signed)
TC to pt ,no answer, lm to call again.

## 2005-07-07 ENCOUNTER — Ambulatory Visit (HOSPITAL_BASED_OUTPATIENT_CLINIC_OR_DEPARTMENT_OTHER): Payer: Charity | Admitting: Family Medicine

## 2005-07-07 VITALS — BP 110/60 | HR 66 | Temp 97.4°F | Wt 114.0 lb

## 2005-07-07 DIAGNOSIS — N644 Mastodynia: Principal | ICD-10-CM

## 2005-07-07 NOTE — Telephone Encounter (Signed)
Staff Message copied by Eliseo Gum on 07/07/2005 at 4:09 PM  ------   Message from: Rolanda Jay   Created: 07/07/2005 at 3:56 PM   Regarding: Returning phone call   Contact: [phone]    Linda Rodriguez [id number], 55 year old, female, [phone] (home) [phone] (work)    CALL BACK NUMBER:[phone] or Ext [phone]- (pt is working for the [employer])  Cell phone:   Other phone:    Available times:    Patient's language of care: English    Patient does not need an interpreter.    Patient's PCP: Tonette Lederer MD, MD    Person calling on behalf of patient: patient (self)    Calls today Returning phonecall  Pt is calling to speak to [facility] because, pt just realized she missed Mary's phone call and would like to speak to her when she has a chance.

## 2005-07-07 NOTE — Telephone Encounter (Signed)
TC to pt and she was given a sv with Dr. Tyler Deis for this evening.

## 2005-07-07 NOTE — Telephone Encounter (Signed)
Staff Message copied by Waymond Cera F on 07/07/2005 at 8:09 AM  ------   Message from: Rolanda Jay   Created: 07/06/2005 at 5:28 PM   Regarding: Question   Contact: [phone]    Linda Rodriguez [id number], 55 year old, female, [phone] (home) [phone] (work)    Cleotis Lema NUMBER: [phone]  Cell phone:   Other phone:    Available times:    Patient's language of care: English    Patient does not need an interpreter.    Patient's PCP: Tonette Lederer MD, MD    Person calling on behalf of patient: patient (self)    Calls today   with questions and concerns.    Pt is calling because she wants to speak to her doctor about the pain she is having in her breast. She would like to know what else she can do for this because nothing is making the pain go away. When you have a chance to give the pt a call back.    Thank you

## 2005-07-07 NOTE — Telephone Encounter (Addendum)
T/c to pt. No answer ,left message requesting a call back. M.Edwyna Shell LPN  1/61/09 called placed to pt. Again today No answer. No answering machne.M.[facility] LPN

## 2005-07-08 ENCOUNTER — Telehealth (HOSPITAL_BASED_OUTPATIENT_CLINIC_OR_DEPARTMENT_OTHER): Payer: Self-pay | Admitting: Family Medicine

## 2005-07-11 NOTE — Progress Notes (Signed)
SUBJECTIVE:  One month history of severe tenderness in the UOQ of her left breast. Postmenopausal.    Past Surgical History:   BREAST RECONSTRUCTION 1995    Comment: breast reduction      OBJECTIVE:  BP 110/60  Pulse 66  Temp (Src) 97.4 (Oral)  Wt 114 lbs (51.7kg)  LMP Postmenopausal  Looks well  Right breast no masses or tenderness, surgical scars  Left has an area 3-4 cm in size in the UOQ that is not well defined but is distinctly fuller than the symmetric area on teh right breast and is very tender. I see no redness or evidence of mastitis/cellulitis. No axillary nodes.    ASSESSMENT:  Breast pain and fullness    PLAN:  Breast center referral with mammogram and ultrasound

## 2005-07-14 ENCOUNTER — Encounter (HOSPITAL_BASED_OUTPATIENT_CLINIC_OR_DEPARTMENT_OTHER): Payer: Self-pay

## 2005-07-14 ENCOUNTER — Ambulatory Visit (HOSPITAL_BASED_OUTPATIENT_CLINIC_OR_DEPARTMENT_OTHER): Payer: Self-pay | Admitting: Surgery

## 2005-07-14 DIAGNOSIS — N644 Mastodynia: Principal | ICD-10-CM

## 2005-07-16 LAB — SURGICAL SPECIALTIES LETTER

## 2005-07-18 NOTE — Telephone Encounter (Signed)
This encounter was opened in error.  Please disregard.

## 2005-08-25 ENCOUNTER — Ambulatory Visit (HOSPITAL_BASED_OUTPATIENT_CLINIC_OR_DEPARTMENT_OTHER): Payer: Charity | Admitting: Family Medicine

## 2005-08-25 VITALS — BP 103/60 | Temp 98.0°F | Wt 114.0 lb

## 2005-08-25 DIAGNOSIS — IMO0002 Reserved for concepts with insufficient information to code with codable children: Secondary | ICD-10-CM

## 2005-08-25 DIAGNOSIS — Z0000 Encounter for general adult medical examination without abnormal findings: Principal | ICD-10-CM

## 2005-08-25 DIAGNOSIS — N952 Postmenopausal atrophic vaginitis: Secondary | ICD-10-CM | POA: Insufficient documentation

## 2005-08-25 DIAGNOSIS — Z833 Family history of diabetes mellitus: Secondary | ICD-10-CM

## 2005-08-25 DIAGNOSIS — Z139 Encounter for screening, unspecified: Secondary | ICD-10-CM

## 2005-08-25 DIAGNOSIS — M25559 Pain in unspecified hip: Secondary | ICD-10-CM

## 2005-08-25 DIAGNOSIS — B009 Herpesviral infection, unspecified: Secondary | ICD-10-CM

## 2005-08-25 DIAGNOSIS — E785 Hyperlipidemia, unspecified: Secondary | ICD-10-CM

## 2005-08-25 MED ORDER — DENAVIR 1 % EX CREA
TOPICAL_CREAM | CUTANEOUS | Status: DC
Start: 2005-08-25 — End: 2005-08-27

## 2005-08-25 NOTE — Patient Instructions (Signed)
I will ask about alternative treatments for your Gyn issues.    You will come for fasting labs next week (Tuesday, 8:30) and will schedule bone densitometry.    If I have not contacted you sooner, please feel free to call me in about a 10 days to see what results I have and what I have learned.

## 2005-08-26 ENCOUNTER — Ambulatory Visit (HOSPITAL_BASED_OUTPATIENT_CLINIC_OR_DEPARTMENT_OTHER): Payer: Self-pay | Admitting: Family Medicine

## 2005-08-27 MED ORDER — ACYCLOVIR 5 % EX OINT
TOPICAL_OINTMENT | CUTANEOUS | Status: DC
Start: 2005-08-27 — End: 2006-07-08

## 2005-08-27 NOTE — Progress Notes (Signed)
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 Patient also has other current concerns as detailed below.  I have reviewed the patient's medical history in detail and updated the computerized patient record.  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]    Current outpatient prescriptions prior to 08/25/05:  RANITIDINE HCL 150 MG OR TABS, 1 TABLET TWICE DAILY as needed, Disp: 30, Rfl: 2  ESTROGENS, CONJUGATED 0.625 MG/GM VA CREA, 1 grams daily in the vagina then reduce gradually to the lowest dose possible, Disp: 42.4 x 2, Rfl: 11  MEDROXYPROGESTERONE ACETATE 2.5 MG OR TABS, 1 TABLET DAILY FOR 2 WEEKS OF EVERY MONTH, Disp: 14, Rfl: 11  FENOFIBRATE 134 MG OR CAPS, TAKES 250 MG FROM [place] EVERY OTHER DAY, Disp: 30, Rfl: 0  ATORVASTATIN CALCIUM 10 MG OR TABS, EVERY OTHER DAY, Disp: 30, Rfl: 0  CALCIUM 600 600 MG OR TABS, 2 daily, Disp: , Rfl: 0  [facility] ENTERIC ASPIRIN 81 MG OR TBEC, 1 TABLET DAILY, Disp: 30, Rfl: 0    Allergies: Propoxyphene, Diclofenac Potassium    Immunization History   Hep B,Adult dose 03/27/2004 04/27/2004 10/26/2004    PPD 07/01/2004    TD 03/23/1994 07/03/2004   Past Medical History:   menopause 2001 08/10/2004    MITRAL VALVE DISORDER 08/10/2004    Comment: History of rheumatic fever Echo 9/01 mild    mitral regurgitation   CALCULUS OF KIDNEY 08/10/2004    HYPERLIPIDEMIA NEC/NOS 08/10/2004    Comment: LDL 194  Past Surgical History:   BREAST RECONSTRUCTION 1995    Comment: breast reduction  Social History   Marital Status: Single Spouse Name:    Years of Education: Number of children:     Occupational History  Occupation Clinical cytogeneticist UNEMPLOYED     Social History Main Topics   Tobacco Use: Never    Alcohol Use: Yes 1  oz/week   Drug Use: No    Sexual Activity: Yes Partners with: Female    Other Topics Concern  MILITARY No  BLOOD TRANSFUSIONS No  CAFFEINE No  OCCUPATIONAL EXPOSURE No  SLEEP CONCERN No  STRESS CONCERN No  WEIGHT CONCERN No  DIET No  EXERCISE    Comment: planning to start  SEAT BELT Yes  SELF EXAMS Yes    Social History Narrative   None on file       Review of patient's family history indicates:   Heart Father    Comment: MI age 56   Osteoporosis Mother    Asthma Sister    Asthma Brother    Diabetes Brother    Tree surgeon Abuse Brother    Cataracts Father       Review of Systems:   Skin: negative  Eyes: negative  Ears/Nose/Throat: negative  Respiratory: negative  Cardiovascular: negative  Gastrointestinal: negative  Genitourinary: see below  Gyn:  no breast pain or new or enlarging lumps on self exam  no vaginal bleeding  amenorhea    Musculoskeletal: see below  Neurologic: negative  Endocrine: negative  Psychiatric: negative  Hematologic/Lymphatic/Immunologic: negative    Physical:  BP 103/60  Temp (Src) 98 (Oral)  Wt 114 lbs (51.7kg)  LMP Postmenopausal  There is no height  on file for this encounter.  General appearance: healthy, alert, well developed, well nourished  Eyes: negative, PERLA, EOM's full  Skin: skin color, texture, turgor are normal  Head: Normocephalic. No masses, lesions, tenderness or abnormalities  Ears: External ears normal. Canals clear. TM's normal.  Nose/Sinuses: negative  Oropharynx: Lips, mucosa, and tongue normal. Teeth and gums normal. Oropharynx moist and without lesion  Neck: supple. No adenopathy. Thyroid symmetric, normal size, and without nodularity  Back: symmetric, no curvature.   Lungs: chest symmetric with normal A/P diameter, no chest deformities noted, normal respiratory rate and rhythm, lungs clear to auscultation  Heart: regular rate and rhythm, S1 normal, S2 normal, no murmurs, clicks, or gallops  Breast: symmetric, no dominant or suspicious mass, no skin or nipple  changes, no axillary adenopathy.   Breast self exam is taught and encouraged  Abdomen: soft, non-tender. No masses, no organomegaly  Extremities: normal. No deformities, edema, or skin discoloration  Musculoskeletal: Muscular strength intact.  Neuro: mental status intact, cranial nerves 2-12 intact, muscle tone normal, normal gait   Pelvic: external genitalia normal, vaginal mucosa mildly atrophic with fairly tight introitus, cervix clear, normal sized uterus, adnexae negative, no discharge whatsoever.  Rectal: deferred    Health Counseling:  Smoking: Reviewed and Discussed  Substance Use Issues: Reviewed and Discussed   Seat Belts: Reviewed and Discussed  Smoke Detectors: Reviewed and Discussed  Diet, Exercise, Wt. 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    Plan: LIPID PANEL, ALANINE AMINO (ALT) (SGPT),    ROUTINE VENIPUNCTURE       V18.0 FAMILY HX DIABETES MELLITUS  Plan: GLUCOSE FASTING     719.45 JOINT PAIN-PELVIS  Note: Worried because she has been having a little pain in her left hip last couple weeks. Is this related to the "rheumatism" she had as a child that was quite disabling for about a  year?  OBJECTIVE:  Full ROM hip. Painful area is well above the greater trochanter  ASSESSMENT:  Likely local inflammation, could be OA  Plan: OK to use ibuprofen; printed hip stretches given.    625.0 DYSPAREUNIA  Note: This is her biggest concern. Always painful with penetration. It was much better when she was using vaginal estrogens. Lubricants alone have not been helpful. No disharge, itch, odor  SHe has a history of lacunar infarct caudate nucleus and is well aware of increased stroke risk with oral estrogen use and that vaginal estrogens are absorbed, too.  Plan: We discussed this at some length. I will discuss with colleagues whether there are any alternatives to vaginal estrogen that I may not be aware of. However, she  ultimately is likely to need to decide whether she prefers to accept a small increase in stroke risk in order to be able to use vaginal estrogens at the lowest possible dose. No decision made tonight. Advised continue ASA in any case.

## 2005-08-30 LAB — CHLAMYDIA GC NAAT
GENPROBE CHLAMYDIA: NEGATIVE
GENPROBE GC: NEGATIVE

## 2005-08-31 ENCOUNTER — Other Ambulatory Visit (HOSPITAL_BASED_OUTPATIENT_CLINIC_OR_DEPARTMENT_OTHER): Payer: Charity | Admitting: Lab

## 2005-08-31 DIAGNOSIS — R1013 Epigastric pain: Principal | ICD-10-CM

## 2005-08-31 DIAGNOSIS — Z139 Encounter for screening, unspecified: Secondary | ICD-10-CM

## 2005-08-31 DIAGNOSIS — Z833 Family history of diabetes mellitus: Secondary | ICD-10-CM

## 2005-08-31 DIAGNOSIS — K3189 Other diseases of stomach and duodenum: Principal | ICD-10-CM

## 2005-08-31 DIAGNOSIS — E785 Hyperlipidemia, unspecified: Secondary | ICD-10-CM

## 2005-08-31 LAB — BLOOD COUNT COMPLETE AUTOMATED
HEMATOCRIT: 35.3 % — ABNORMAL LOW (ref 37.0–47.0)
HEMOGLOBIN: 12.2 g/dL (ref 12.0–16.0)
MEAN CORP HGB CONC: 34.6 g/dL (ref 32.0–36.0)
MEAN CORPUSCULAR HGB: 30.4 pg (ref 27.0–31.0)
MEAN CORPUSCULAR VOL: 87.7 fL (ref 81.0–99.0)
MEAN PLATELET VOLUME: 9.1 fL (ref 6.4–10.8)
PLATELET COUNT: 331 10*3/uL (ref 150–400)
RBC DISTRIBUTION WIDTH: 12.7 % (ref 11.5–14.3)
RED BLOOD CELL COUNT: 4.03 MIL/uL — ABNORMAL LOW (ref 4.20–5.40)
WHITE BLOOD CELL COUNT: 5.4 10*3/uL (ref 4.8–10.8)

## 2005-08-31 LAB — CHG LIPID PANEL
Cholesterol: 179 mg/dl (ref 0–200)
HIGH DENSITY LIPOPROTEIN: 62 mg/dl (ref 35–85)
LOW DENSITY LIPOPROTEIN DIRECT: 95 mg/dl (ref 0–100)
RISK FACTOR: 2.9 (ref ?–4.4)
TRIGLYCERIDES: 78 mg/dl (ref 0–150)

## 2005-08-31 LAB — TRANSFERASE ALANINE AMINO ALT SGPT: ALANINE AMINOTRANSFERASE: 18 IU/L (ref 7–35)

## 2005-08-31 LAB — GLUCOSE FASTING: GLUCOSE FASTING: 81 mg/dl (ref 74–118)

## 2005-08-31 NOTE — Progress Notes (Addendum)
Addended by: Elio Forget on: 08/31/2005 2:42:29 PM     Modules accepted: Orders

## 2005-08-31 NOTE — Nursing Note (Signed)
>>   Linda Rodriguez     08/31/2005   8:56 am  Labs drawn.

## 2005-09-01 ENCOUNTER — Other Ambulatory Visit (HOSPITAL_BASED_OUTPATIENT_CLINIC_OR_DEPARTMENT_OTHER): Payer: Charity | Admitting: Lab

## 2005-09-01 DIAGNOSIS — K319 Disease of stomach and duodenum, unspecified: Principal | ICD-10-CM

## 2005-09-01 LAB — HUMAN PAPILLOMAVIRUS (HPV)
HUMAN PAPILLOMAVIRUS: POSITIVE
ORGANISM: HIGH — AB

## 2005-09-01 NOTE — Nursing Note (Signed)
>>   Linda Rodriguez     09/01/2005   12:14 pm  Specimen.

## 2005-09-02 LAB — HELICOBACTER PYLORI STOOL AG: HELICOBACTER PYLORI STOOL ANTIGEN: POSITIVE

## 2005-09-02 LAB — VITAMIN D,25 HYDROXY: VITAMIN D,25 HYDROXY: 32.7 ng/mL (ref 32.0–100.0)

## 2005-09-04 LAB — CYTOPATH, C/V, THIN LAYER

## 2005-09-09 ENCOUNTER — Ambulatory Visit (HOSPITAL_BASED_OUTPATIENT_CLINIC_OR_DEPARTMENT_OTHER): Payer: Charity | Admitting: Obstetrics & Gynecology

## 2005-09-09 VITALS — BP 122/62 | Wt 113.0 lb

## 2005-09-09 DIAGNOSIS — R87810 Cervical high risk human papillomavirus (HPV) DNA test positive: Secondary | ICD-10-CM

## 2005-09-09 DIAGNOSIS — N952 Postmenopausal atrophic vaginitis: Principal | ICD-10-CM

## 2005-09-10 DIAGNOSIS — R87810 Cervical high risk human papillomavirus (HPV) DNA test positive: Secondary | ICD-10-CM

## 2005-09-10 HISTORY — DX: Cervical high risk human papillomavirus (HPV) DNA test positive: R87.810

## 2005-09-13 ENCOUNTER — Other Ambulatory Visit: Payer: Self-pay | Admitting: Surgery

## 2005-09-13 LAB — MA SCREENING MAMMO BILATERAL WITH CAD

## 2005-09-17 NOTE — Progress Notes (Signed)
Hx reviewed  Pt c/o significant vaginal dryness and painful intercourse    Pt prefers not to use topical E2 unless no alternative    Discussed with pt physiological changes of GU tract with mennopause    Discussed recent PAp wnl but HPV positive  Reviewed with pt need for colpo  Pt to schedule    NEFG  Vagia marked atrophic changes  Thin walled    Atrophic vaginiatis  Trial lubricants  F/u 6 wks reeval/colpo

## 2005-09-30 ENCOUNTER — Ambulatory Visit (HOSPITAL_BASED_OUTPATIENT_CLINIC_OR_DEPARTMENT_OTHER): Payer: Charity | Admitting: Obstetrics & Gynecology

## 2005-09-30 VITALS — BP 112/68

## 2005-09-30 DIAGNOSIS — R87810 Cervical high risk human papillomavirus (HPV) DNA test positive: Principal | ICD-10-CM

## 2005-09-30 LAB — URINE PREGNANCY TEST (POINT OF CARE): HCG QUALITATIVE URINE: NEGATIVE

## 2005-09-30 NOTE — Progress Notes (Signed)
PATIENT/PROCEDURE VERIFICATION DOCUMENTATION    Correct patient: Yes  Correct procedure: Yes  Correct side, site, mark visible if applicable: N/A  Correct position: N/A  Special equipment/implant(s) present, if applicable: N/A    Time-out completed, documented by provider doing procedure or designated team member:  Merceda Elks, MD 09/30/2005 33:26 PM  55 year old female presents for colposcopy  No LMP date recorded. Reason: Postmenopausal.   PCP: Tonette Lederer MD, MD    Patient pregnant? No  Risk factors: HPV  Pap smear always normal 6/07 wnl months ago showed:     Details of the procedure, indications, risks and follow up were discussed with the patient, questions answered and verbal consent obtained.    PROCEDURE:  Cervix was visualized and swabbed with 3% acetic acid.  The transformation zone was completely visualized.    FINDINGS:  Cervix: no visible lesions    Pap smear: not obtained    ECC: Done    Biopsy: Not Done    CLINICAL IMPRESSION:normal exam without visible pathology  Examination was satisfactory    DISPOSITION:  Repeat cytology in 12 months

## 2005-10-05 LAB — SURGICAL PATH SPECIMEN

## 2005-12-08 ENCOUNTER — Encounter (HOSPITAL_BASED_OUTPATIENT_CLINIC_OR_DEPARTMENT_OTHER): Payer: Self-pay

## 2006-02-15 ENCOUNTER — Telehealth (HOSPITAL_BASED_OUTPATIENT_CLINIC_OR_DEPARTMENT_OTHER): Payer: Self-pay | Admitting: Family Medicine

## 2006-02-15 NOTE — Telephone Encounter (Signed)
E-mail reply sent with copy of Lexi-comp interaction monograph advising against combo

## 2006-02-15 NOTE — Telephone Encounter (Signed)
Hi Dr. Tyler Deis,    I am interested taking Ginkgo Biloba to increase my concentration, but once I take baby aspirin, I wonder if I can take the Ginkgo Biloba also. In fact I bought one bottle of Ginkgo 120mg  and had started taking it, when talking with my sister she alerting me saying that it is a blood thinner.    Thanks for your opinion and attention,      Elzina Hiltner  [place] [employer]  [phone] Pager

## 2006-03-01 ENCOUNTER — Ambulatory Visit (HOSPITAL_BASED_OUTPATIENT_CLINIC_OR_DEPARTMENT_OTHER): Payer: PRIVATE HEALTH INSURANCE | Admitting: Family Medicine

## 2006-03-01 VITALS — BP 110/74 | HR 74 | Temp 97.7°F | Resp 18 | Wt 113.2 lb

## 2006-03-01 DIAGNOSIS — D509 Iron deficiency anemia, unspecified: Secondary | ICD-10-CM

## 2006-03-01 DIAGNOSIS — N739 Female pelvic inflammatory disease, unspecified: Principal | ICD-10-CM

## 2006-03-01 DIAGNOSIS — S6390XA Sprain of unspecified part of unspecified wrist and hand, initial encounter: Secondary | ICD-10-CM

## 2006-03-01 MED ORDER — FERROUS SULFATE 325 MG OR CAPS
ORAL_CAPSULE | ORAL | Status: DC
Start: 2006-03-01 — End: 2006-07-08

## 2006-03-01 NOTE — Progress Notes (Signed)
SUBJECTIVE: Linda Rodriguez is a 55 year old female here with itching/irritation on labia. Noticed it while showering, and she has a lump there which has gotten bigger over the past 3 days, notes a burning sensation, and there was some "pus" there this morning. Wearing pants irritates it. Thinks she had it once before about a year ago.    Single, no partners. Broke up with boyfriend of 2 1/2 years at the end of September.    No fevers, no vaginal disharge.    Also wondering about pain in her right hand. She hurt the palm at the base of the fingers over the weekend while pushing a heavy trunk. Worried because it still hurts.    Also wants to know fasting lab results form last visit.    OBJECTIVE:  Generally well-appearing, alert, appropriate, NAD.  BP 110/74   Pulse 74   Temp 97.7   Resp 18   Wt 113 lbs 3.2 oz (51.3kg)   LMP Postmenopausal  GU: Small firm redsuperficial skin abscess on left labia majora, about 6 mm, has a head, but not draining. Minimally tender.   Hand unremarkable    Labs show normal cholesterol (is on meds), normal glucose.  CBC shows mild anemia with decreased HCT and RBC.    ASSESSMENT AND PLAN:    ASSESSMENT AND PLAN:    616.9 FEMALE GEN INFLAM NOS (primary encounter diagnosis)  Note: Does not appear to be an STD. Likley caused by an ingrown hair or other minor trauma. 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 Would initially treat with iron. However, in looking back, this has come on gradually over the past few years, and would consider referral to heme if does not improve with 3 months treatment. Had normal colonoscopy in 2003, but should be screened for FOB. I have left her a message explaining this, and asking her to come back and pick up the stool cards.  Plan: FERROUS SULFATE 325 MG OR CAPS       842.10 SPRAIN OF HAND NOS  Note: reassured that this is likely just a mild bruise, and will  heal.

## 2006-05-27 ENCOUNTER — Ambulatory Visit (HOSPITAL_BASED_OUTPATIENT_CLINIC_OR_DEPARTMENT_OTHER): Payer: PRIVATE HEALTH INSURANCE | Admitting: Obstetrics & Gynecology

## 2006-05-27 VITALS — BP 112/60 | Wt 110.0 lb

## 2006-05-27 DIAGNOSIS — N952 Postmenopausal atrophic vaginitis: Secondary | ICD-10-CM

## 2006-05-27 DIAGNOSIS — R87810 Cervical high risk human papillomavirus (HPV) DNA test positive: Principal | ICD-10-CM

## 2006-06-10 LAB — HUMAN PAPILLOMAVIRUS (HPV)
HUMAN PAPILLOMAVIRUS: POSITIVE
ORGANISM: HIGH — AB

## 2006-06-15 LAB — CYTOPATH, C/V, THIN LAYER

## 2006-06-16 ENCOUNTER — Encounter (HOSPITAL_BASED_OUTPATIENT_CLINIC_OR_DEPARTMENT_OTHER): Payer: Self-pay

## 2006-07-02 NOTE — Progress Notes (Signed)
Here for f/u PAP  Prior PAP wnl HPV pos  Colpo done no pathology  Repeat PAP today    Still with some c/o vaginal dryness occ irritation  Using E2 cream prn      NEFG  Vagina clear  Atrophic changes  Cervix no lesions  PAP/HPV    F/u PAP and reeval    A. Baltazar Apo MD

## 2006-07-08 ENCOUNTER — Ambulatory Visit (HOSPITAL_BASED_OUTPATIENT_CLINIC_OR_DEPARTMENT_OTHER): Payer: PRIVATE HEALTH INSURANCE | Admitting: Obstetrics & Gynecology

## 2006-07-08 VITALS — BP 110/70

## 2006-07-08 DIAGNOSIS — IMO0001 Reserved for inherently not codable concepts without codable children: Secondary | ICD-10-CM

## 2006-07-08 DIAGNOSIS — R87810 Cervical high risk human papillomavirus (HPV) DNA test positive: Secondary | ICD-10-CM

## 2006-07-08 DIAGNOSIS — Z0000 Encounter for general adult medical examination without abnormal findings: Secondary | ICD-10-CM

## 2006-07-08 DIAGNOSIS — E785 Hyperlipidemia, unspecified: Principal | ICD-10-CM

## 2006-07-08 LAB — URINE PREGNANCY TEST (POINT OF CARE): HCG QUALITATIVE URINE: NEGATIVE

## 2006-07-13 LAB — SURGICAL PATH SPECIMEN

## 2006-07-19 NOTE — Progress Notes (Signed)
PATIENT/PROCEDURE VERIFICATION DOCUMENTATION    Correct patient: Yes  Correct procedure: Yes  Correct side, site, mark visible if applicable: N/A  Correct position: N/A  Special equipment/implant(s) present, if applicable: N/A    Time-out completed, documented by provider doing procedure or designated team member:  Merceda Elks, MD 07/19/2006 34:27 PM  56 year old female presents for colposcopy  No LMP date recorded. Reason: Postmenopausal.   PCP: Tonette Lederer MD    Patient pregnant? No  Risk factors: HPV  Pap smear 2 months ago showed: ASCUS. Prior pap(s) showed ASCUS.   Prior colposcopy: Yes      Details of the procedure, indications, risks and follow up were discussed with the patient, questions answered and verbal consent obtained.    PROCEDURE:  Cervix was visualized and swabbed with 3% acetic acid.  The transformation zone was completely visualized with manipulation    FINDINGS:  Cervix: no visible lesions    Pap smear: not obtained    ECC: Done    Biopsy: Not Done    CLINICAL IMPRESSION:normal exam without visible pathology  Examination was satisfactory    DISPOSITION:  Repeat cytology in 4 months

## 2006-07-28 ENCOUNTER — Encounter (HOSPITAL_BASED_OUTPATIENT_CLINIC_OR_DEPARTMENT_OTHER): Payer: Self-pay

## 2006-09-28 ENCOUNTER — Telehealth (HOSPITAL_BASED_OUTPATIENT_CLINIC_OR_DEPARTMENT_OTHER): Payer: Self-pay

## 2006-09-28 DIAGNOSIS — E785 Hyperlipidemia, unspecified: Secondary | ICD-10-CM

## 2006-09-28 DIAGNOSIS — Z139 Encounter for screening, unspecified: Principal | ICD-10-CM

## 2006-09-28 NOTE — Telephone Encounter (Signed)
Staff Message copied by Gaynelle Arabian on Wed Sep 28, 2006 3:19 PM  ------   Message from: Rolanda Jay   Created: Wed Sep 28, 2006 2:32 PM   Regarding: SICK   Contact: [phone]    TARYA MCCOSH [id number], 56 year old, female, Telephone Information:  Home Phone [phone]  Work Phone [phone]  Mobile [phone]      Cleotis Lema NUMBER: [phone]  Cell phone:   Other phone:    Available times:    Patient's language of care: English    Patient does not need an interpreter.    Patient's PCP: Tonette Lederer MD    Person calling on behalf of patient: patient (self)    Calls today   with a sick call.  with questions and concerns.  Pt is calling to speak with a nurse because the pt has been having a cold for about a month. The is unable to really sleep at night because her cold keeps her up. The pt would like to see if Dr. Tyler Deis is able to see her today or if she can communicate with the nurse to see what she can do.    Thank you    Patient's Preferred Pharmacy:   No Pharmacies Listed

## 2006-09-28 NOTE — Telephone Encounter (Signed)
Staff Message copied by Tonette Lederer on Wed Sep 28, 2006 8:26 PM  ------   Message from: Madilyn Fireman   Created: Tue Sep 27, 2006 4:52 PM   Regarding: Lab order   Contact: [phone]    Patient wants to come in fasting for HDL, trygleride, cholesterol and DM labs. I will call her once orders are in. CPE booked for 10/26/06

## 2006-09-28 NOTE — Telephone Encounter (Signed)
P/c from pt stating she has had a cold for a month and would like to be seen today.  P/C to call back number invalid, left message on cell phone to call us back.

## 2006-10-19 ENCOUNTER — Ambulatory Visit (HOSPITAL_BASED_OUTPATIENT_CLINIC_OR_DEPARTMENT_OTHER): Payer: No Typology Code available for payment source | Admitting: Lab

## 2006-10-19 DIAGNOSIS — E785 Hyperlipidemia, unspecified: Principal | ICD-10-CM

## 2006-10-19 DIAGNOSIS — Z139 Encounter for screening, unspecified: Secondary | ICD-10-CM

## 2006-10-19 LAB — CHG LIPID PANEL
Cholesterol: 174 mg/dl (ref 0–200)
HIGH DENSITY LIPOPROTEIN: 66 mg/dl (ref 35–85)
LOW DENSITY LIPOPROTEIN DIRECT: 94 mg/dl (ref 0–100)
RISK FACTOR: 2.6 (ref ?–4.4)
TRIGLYCERIDES: 106 mg/dl (ref 0–150)

## 2006-10-19 LAB — BLOOD COUNT COMPLETE AUTOMATED
HEMATOCRIT: 37.3 % (ref 36.0–48.0)
HEMOGLOBIN: 12.3 g/dl (ref 12.0–16.0)
MEAN CORP HGB CONC: 33 g/dl (ref 32.0–36.0)
MEAN CORPUSCULAR HGB: 29.1 pg (ref 27.0–33.0)
MEAN CORPUSCULAR VOL: 88.4 fl (ref 80.0–100.0)
MEAN PLATELET VOLUME: 9.3 fl (ref 6.4–10.8)
PLATELET COUNT: 375 10*3/uL (ref 150–400)
RBC DISTRIBUTION WIDTH: 12.9 % (ref 11.5–14.3)
RED BLOOD CELL COUNT: 4.22 M/uL — ABNORMAL LOW (ref 4.50–5.10)
WHITE BLOOD CELL COUNT: 6.4 10*3/uL (ref 4.0–10.8)

## 2006-10-19 LAB — GLUCOSE FASTING: GLUCOSE FASTING: 90 mg/dl (ref 74–118)

## 2006-10-19 NOTE — Progress Notes (Signed)
Labs drawn

## 2006-10-26 ENCOUNTER — Encounter (HOSPITAL_BASED_OUTPATIENT_CLINIC_OR_DEPARTMENT_OTHER): Payer: Self-pay | Admitting: Family Medicine

## 2006-10-26 ENCOUNTER — Ambulatory Visit (HOSPITAL_BASED_OUTPATIENT_CLINIC_OR_DEPARTMENT_OTHER): Payer: No Typology Code available for payment source | Admitting: Family Medicine

## 2006-10-26 VITALS — BP 120/70 | HR 74 | Resp 18 | Ht 61.0 in | Wt 113.6 lb

## 2006-10-26 DIAGNOSIS — E785 Hyperlipidemia, unspecified: Principal | ICD-10-CM

## 2006-10-26 DIAGNOSIS — Z0000 Encounter for general adult medical examination without abnormal findings: Secondary | ICD-10-CM

## 2006-10-26 MED ORDER — CALCIUM 600-D 600-400 MG-UNIT PO TABS
ORAL_TABLET | ORAL | Status: DC
Start: 2006-10-26 — End: 2007-06-15

## 2006-10-26 NOTE — Progress Notes (Signed)
Chief Complaint:  Linda Rodriguez is a 56 year old female who presents for a physical exam.   I have reviewed the patient's medical history in detail and updated the computerized patient record.  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]   Date Noted: 08/25/2005   Replens   MITRAL VALVE DISORDER [424.0]   Date Noted: 08/10/2004   History of rheumatic fever   Echo 9/01 mild mitral regurgitation   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]   Date Noted: 08/10/2004   HYPERLIPIDEMIA NEC/NOS [272.4]   Date Noted: 08/10/2004   LDL 194      MENOPAUSE 2001 [256.3]   Date Noted: 08/10/2004   Strong FH osteoporosis; bone densitometry ordered    2007    Medications at the end of today's visit are:  ATORVASTATIN CALCIUM 10 MG OR TABS, 1 TABLET DAILY (buys in [place])    CALCIUM 600-D 600-400 MG-UNIT OR TABS, 1 tablet by mouth twice a day to prevent osteoporosis    FENOFIBRATE 134 MG OR CAPS, TAKES 250 MG FROM [place] EVERY OTHER DAY    [facility] ENTERIC ASPIRIN 81 MG OR TBEC, 1 TABLET DAILY  Allergies: Propoxyphene, Diclofenac Potassium    Immunization History   Hep B,Adult dose 03/27/2004 04/27/2004 10/26/2004    PPD 07/01/2004    TD 03/23/1994 07/03/2004   Past Medical History:   menopause 2001 08/10/2004    MITRAL VALVE DISORDER 08/10/2004    Comment: History of rheumatic fever Echo 9/01 mild    mitral regurgitation   CALCULUS OF KIDNEY 08/10/2004    HYPERLIPIDEMIA NEC/NOS 08/10/2004    Comment: LDL 194  Past Surgical History:   BREAST RECONSTRUCTION 1995    Comment: breast reduction  Social History   Marital Status: Single Spouse Name:    Years of Education: Number of children:     Occupational History  Occupation Clinical cytogeneticist UNEMPLOYED     Social  History Main Topics   Tobacco Use: Never    Alcohol Use: Yes 0 oz/week   Comment: less than weekly   Drug Use: No    Sexual Activity: Yes Partners with: Female    Other Topics Concern  Military Service No  Blood Transfusions No  Caffeine Concern No  Occupational Exposure No  Sleep Concern No  Stress Concern No  Weight Concern No  Special Diet No  Exercise Yes   Comment: in [place], walked; needs a companion to walk  [employer] Yes  Self-Exams Yes    Social History Narrative   None on file       Review of patient's family history indicates:   Heart Father    Comment: MI age 32   Osteoporosis Mother    Asthma Sister    Asthma Brother    Diabetes Brother    Tree surgeon Abuse Brother    Cataracts Father    Hypertension Sister    Hypertension Brother    Diabetes Brother    Cancer - Breast FamHxNeg    Cancer - Colon FamHxNeg       Review of Systems:   Skin: negative  Eyes: negative  Ears/Nose/Throat: negative  Respiratory: negative  Cardiovascular: negative  Gastrointestinal: negative  Genitourinary: negative  ZOX:WRUEAVW dryness  Musculoskeletal: negative  Neurologic: negative  Endocrine: negative  Psychiatric: negative  Hematologic/Lymphatic/Immunologic: negative    Physical:  BP 120/70  Pulse 74  Resp 18  Ht 5\' 1"  (1.549 m)  Wt 113 lb 9.6 oz (51.529 kg)  LMP Postmenopausal  Body mass index is 21.46 kg/(m^2).  General appearance: healthy, alert, well developed, well nourished  Eyes: negative, PERLA, EOM's full  Skin: skin color, texture, turgor are normal  Head: Normocephalic. No masses, lesions, tenderness or abnormalities  Ears: External ears normal. Canals clear. TM's normal.  Nose/Sinuses: negative  Oropharynx: Lips, mucosa, and tongue normal. Teeth and gums normal. Oropharynx moist and without lesion  Neck: supple. No adenopathy. Thyroid symmetric, normal size, and without nodularity  Back: symmetric, no curvature.   Lungs: chest symmetric with normal A/P diameter, no chest deformities noted, normal respiratory  rate and rhythm, lungs clear to auscultation  Heart: regular rate and rhythm, S1 normal, S2 normal, no murmurs, clicks, or gallops  Breast: symmetric, no dominant or suspicious mass, no skin or nipple changes, no axillary adenopathy.   Breast self exam is taught and encouraged  Abdomen: soft, non-tender. No masses, no organomegaly  Extremities: normal. No deformities, edema, or skin discoloration  Musculoskeletal: Muscular strength intact.  Neuro: mental status intact, cranial nerves 2-12 intact, muscle tone normal, normal gait   Pelvic: Done by OB/Gyn 4/08    Rectal: deferred    Health Counseling:  Smoking: Reviewed and Discussed  Substance Use Issues: Reviewed and Discussed   Seat Belts: Reviewed and Discussed    Diet, Exercise, Wt. Control: Reviewed and Discussed  Dental Health: Reviewed and Discussed  Vision Health: Reviewed and Discussed  Mental Health: Reviewed and Discussed  Domestic Violence: Reviewed and Discussed  Sexual Health: Reviewed and Discussed  BSE: Reviewed and Discussed  Osteoporosis: Reviewed and Discussed   Pap screening up to date  Mammogram she will schedule  Colorectal cancer screening up to date      ASSESSMENT/PLAN:    272.4 HYPERLIPIDEMIA NEC/NOS (primary encounter diagnosis)    LDL (mg/dl)   Date Value   0/98/11 94   Stable, no change      Plan: ATORVASTATIN CALCIUM 10 MG OR TABS     V70.0 Routine General Medical Examination at a Health Care Facility  Plan: CALCIUM 600-D 600-400 MG-UNIT OR TABS

## 2006-11-15 ENCOUNTER — Ambulatory Visit: Payer: Self-pay | Admitting: Family Medicine

## 2006-12-01 LAB — MA SCREENING MAMMO BILATERAL WITH CAD

## 2007-03-22 ENCOUNTER — Ambulatory Visit: Payer: Self-pay | Admitting: Ophthalmology

## 2007-04-11 ENCOUNTER — Telehealth (HOSPITAL_BASED_OUTPATIENT_CLINIC_OR_DEPARTMENT_OTHER): Payer: Self-pay | Admitting: Family Medicine

## 2007-04-11 DIAGNOSIS — Z0000 Encounter for general adult medical examination without abnormal findings: Principal | ICD-10-CM

## 2007-04-17 ENCOUNTER — Ambulatory Visit: Payer: No Typology Code available for payment source | Admitting: Ophthalmology

## 2007-05-01 ENCOUNTER — Telehealth (HOSPITAL_BASED_OUTPATIENT_CLINIC_OR_DEPARTMENT_OTHER): Payer: Self-pay | Admitting: Family Medicine

## 2007-05-01 NOTE — Telephone Encounter (Signed)
Please let her know that current guidelines say that she does not need the antibiotics. From Upto Date:    No longer indicated -- Common valvular lesions for which antimicrobial prophylaxis is no longer recommended in the 2007 AHA guidelines include bicuspid aortic valve, acquired aortic or mitral valve disease (including mitral valve prolapse with regurgitation and those who have undergone prior valve repair), and hypertrophic cardiomyopathy with latent or resting obstruction.

## 2007-05-01 NOTE — Telephone Encounter (Signed)
Staff Message copied by Jeanene Erb on Mon May 01, 2007 10:48 AM  ------   Message from: Madilyn Fireman   Created: Mon May 01, 2007 10:01 AM   Regarding: RX   Contact: [phone]    BALERIA WYMAN [id number], 57 year old, female, Telephone Information:  Home Phone [phone]  Work Phone [phone]  Mobile [phone]      Cleotis Lema NUMBER: [phone]  Cell phone:   Other phone:    Available times:    Patient's language of care: English    Patient does not need an interpreter.    Patient's PCP: Tonette Lederer MD    Person calling on behalf of patient: Patient (self)    Calls today with questions and concerns. Patient needs antibiotics RX to be called into the [facility] [phone]. Patient is having her teeth cleaned this weekend and as a child had a murmer.    Patient's Preferred Pharmacy:   [facility]  Phone: [phone] Fax: [phone]

## 2007-05-01 NOTE — Telephone Encounter (Signed)
Does she need prophylaxis?

## 2007-05-02 NOTE — Telephone Encounter (Signed)
t/c to pt, no answer, message explaining new guidelines.

## 2007-05-29 ENCOUNTER — Telehealth (HOSPITAL_BASED_OUTPATIENT_CLINIC_OR_DEPARTMENT_OTHER): Payer: Self-pay | Admitting: Family Medicine

## 2007-05-29 NOTE — Telephone Encounter (Signed)
Patient due and on the waitlist for PAP, L/M to contact the office to book the appt.

## 2007-05-31 ENCOUNTER — Telehealth (HOSPITAL_BASED_OUTPATIENT_CLINIC_OR_DEPARTMENT_OTHER): Payer: Self-pay | Admitting: Family Medicine

## 2007-05-31 NOTE — Telephone Encounter (Signed)
L/M on voicemail that patient needs to come in to have pap done sooner rather than in August.

## 2007-05-31 NOTE — Telephone Encounter (Signed)
Patient called me back regarding appt for PAP, would like to know if ok to have her pap the same time as her CPE in August. Will cal her back with an update.

## 2007-06-15 ENCOUNTER — Encounter (HOSPITAL_BASED_OUTPATIENT_CLINIC_OR_DEPARTMENT_OTHER): Payer: Self-pay | Admitting: Family Medicine

## 2007-06-15 ENCOUNTER — Encounter (HOSPITAL_BASED_OUTPATIENT_CLINIC_OR_DEPARTMENT_OTHER): Payer: Self-pay

## 2007-06-15 ENCOUNTER — Ambulatory Visit (HOSPITAL_BASED_OUTPATIENT_CLINIC_OR_DEPARTMENT_OTHER): Payer: PRIVATE HEALTH INSURANCE | Admitting: Family Medicine

## 2007-06-15 VITALS — BP 120/80 | HR 74 | Temp 98.2°F | Resp 18 | Ht 61.0 in | Wt 115.0 lb

## 2007-06-15 DIAGNOSIS — M159 Polyosteoarthritis, unspecified: Secondary | ICD-10-CM

## 2007-06-15 DIAGNOSIS — E785 Hyperlipidemia, unspecified: Secondary | ICD-10-CM

## 2007-06-15 DIAGNOSIS — R87810 Cervical high risk human papillomavirus (HPV) DNA test positive: Principal | ICD-10-CM

## 2007-06-15 MED ORDER — VITAMIN D 400 UNITS PO TABS
ORAL_TABLET | ORAL | Status: DC
Start: 2007-06-15 — End: 2008-10-24

## 2007-06-15 MED ORDER — CALCIUM 600-D 600-400 MG-UNIT PO TABS
ORAL_TABLET | ORAL | Status: AC
Start: 2007-06-15 — End: 2008-06-14

## 2007-06-15 NOTE — Progress Notes (Signed)
Document by addendum

## 2007-06-15 NOTE — Patient Instructions (Signed)
All of [place] in the [place] area need extra vitamin D. You should have calcium supplements that have 600 mg of calcium and 400 units of vitamin D in each tablet; take one tablet twice a day. In addition, take a 400 unit tablet of vitamin D.    Labs for cholesterol due in July.    Consortium of gastroenterology societies -- Guidelines, initially prepared in 1997 for the [place] [employer] were updated in 2003 by a consensus panel representing several gastroenterology societies and the American Cancer Society [29] (show algorithm 1). These guidelines recommend:   People with a first-degree relative (parent, sibling, or child) with colon cancer or adenomatous polyp diagnosed at age <60 years, or two first-degree relatives diagnosed at any age should be advised to have screening colonoscopy starting at age 40 years, or 10 years younger than the earliest diagnosis in their family, whichever comes first, and repeated every 5 years.   People with a first-degree relative with colorectal cancer or adenoma diagnosed at age >60 years should be advised to be screened as average-risk persons but beginning at age 63 years. People with two or more second-degree relatives (grandparent, aunt, uncle) with colorectal cancer should similarly be advised to begin screening at age 13 years.   People with one second-degree relative or third-degree relative (great-grandparent or cousin) with colorectal cancer should be advised to be screened as average risk persons.   While some expert groups recommend that screening tests for populations described by the second and third bullet above be the same as for patients at average risk, in practice colonoscopy is often the screening test of choice.  [place] Chief [employer] Officer -- The USPSTF has published a more general approach to screening high risk individuals [30]. They identify increased risk as a first-degree relative diagnosed with colorectal cancer before 58  years of age, and suggest that initiating screening before 58 years of age is reasonable, but do not specify a particular age or screening modality.  [employer] -- The [employer] of [employer] issued guidelines in 2000 for screening patients at high risk for colorectal cancer because of a family history [31]:   Screen with colonoscopy   If the first-degree relative was diagnosed after age 70 years, begin at age 103 years and repeat every 10 years,   If the first-degree relative was diagnosed before 60 years or multiple first-degree relatives have had colorectal cancer, begin screening at age 67 or 10 years before the youngest relative's diagnosis and repeat every three to five years.   American Society of Colon and Rectal Surgeons -- The American Society of Colon and Rectal Surgeons issued guidelines in 1999 for screening patients at high risk for colorectal cancer because of a family history [32]:   Screen with colonoscopy   If colorectal cancer occurs in a first-degree relative with onset age <55 years, or in two first degree relatives, begin at age 92 or 10 years earlier than the age of the relative when the cancer was diagnosed and repeat every five years.   If the first-degree relative was age >55 years, begin at age 42 or 10 years before the relative's diagnosis, whichever is earlier, and repeat every 5 to 10 years.   INFORMATION FOR PATIENTS -- Transport planner on this topic are available for patients. (See "Patient information: Colon cancer screening"). We encourage you to print or e-mail this topic, or to refer patients to our public web site [employer]tn,  which includes this and other topics.  SUMMARY AND RECOMMENDATIONS    One in four patients with colorectal cancer has a family history of colorectal cancer. Three to four percent of patients with colorectal cancer have one of two genetic syndromes caused by autosomal dominant genes: hereditary  nonpolyposis colon cancer (HNPCC) and familial adenomatous polyposis (FAP). (See "Introduction" above).   All patients should be questioned about family history to identify increased risk: Have any blood relatives had colorectal cancer or an adenoma? If so, how many, and were these first-degree relatives (parent, sibling, or child) or second degree relatives (grandparent, cousin, niece or nephew)? Questions should be repeated periodically and interpreted keeping in mind family size, and the possibility of uncertain paternity. (See "Risk assessment" above).   Familial colorectal cancer results from the interaction of genetic and environmental causes. Identification of specific polymorphisms (other than for HNPCC and FAP) has largely led to inconsistent findings. (See "Genetics of familial colorectal cancer" above).   There are no randomized, controlled trials of screening in people with a family history of colorectal cancer. Screening recommendations for these patients are extrapolated from studies and recommendations for average risk patients, modified by the known biology of familial colorectal cancer. We suggest screening high risk patients more aggressively and initiating screening earlier than for average risk patients (Grade 2B). (See "Screening" above).   We follow screening guidelines endorsed by a consortium of gastroenterology societies, and based upon risk stratification: (See "Consortium of gastroenterology societies" above).   - Individuals at highest risk with familial syndromes (HNPCC, FAP) should be screened for CRC with colonoscopy at frequent specified intervals. (See "Screening and management strategies for patients and families with familial colon cancer syndromes").  - Individuals at high risk (a first-degree relative with colon cancer or adenomatous polyp diagnosed at age <60 years, or two first-degree relatives diagnosed at any age) should be advised to have screening colonoscopy starting at  age 34 years, or 10 years younger than the earliest diagnosis in their family, whichever comes first, and repeated every five years.  - Individuals with some increased risk (first-degree relative with colorectal cancer or adenoma diagnosed at age >60 years; or two or more second-degree relatives with colorectal cancer) should be advised to be screened as average-risk persons but beginning at age 42 years. However, it is common practice to recommend screening with colonoscopy.  - Individuals with one second-degree relative or third-degree relative (great-grandparent or cousin) with colorectal cancer should be screened as average risk persons.

## 2007-06-17 DIAGNOSIS — M159 Polyosteoarthritis, unspecified: Secondary | ICD-10-CM | POA: Insufficient documentation

## 2007-06-17 NOTE — Progress Notes (Addendum)
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 Atrophic vaginitis continues, but is not symptomatic in the absence of IC.  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 Glucosamine-chondroitin seems to help  OBJECTIVE:  There is one joint (2d PIP) that is slightly enlarged c/w Hebrides', no acute inflammation  Plan: med list updated, continue OTC success

## 2007-06-21 NOTE — Progress Notes (Addendum)
Addended by: Tonette Lederer on: 06/21/2007 4:19:32 PM     Modules accepted: Orders

## 2007-06-23 LAB — HUMAN PAPILLOMAVIRUS (HPV): HUMAN PAPILLOMAVIRUS: NEGATIVE

## 2007-06-29 LAB — CYTOPATH, C/V, THIN LAYER

## 2007-08-09 ENCOUNTER — Encounter (HOSPITAL_BASED_OUTPATIENT_CLINIC_OR_DEPARTMENT_OTHER): Payer: Self-pay

## 2007-08-09 NOTE — Progress Notes (Signed)
.  [facility] enrollment forms entered.

## 2007-08-29 ENCOUNTER — Encounter (HOSPITAL_BASED_OUTPATIENT_CLINIC_OR_DEPARTMENT_OTHER): Payer: Self-pay

## 2007-08-29 NOTE — Progress Notes (Signed)
[facility]-CHART REVIEW    Breast Cancer   Clinical Breast Exam result: Normal Breast Exam       Mammography BI-RADS result: Normal or Probably Normal Mammogram (BIRADS 1,2,3)      Cervical Cancer Screening   PAP Test Result: Normal PAP Test Result     Cardiovascular Risk Screening   High Risk Finding: None of the Above/Not High Risk                Elevated Risk:    None of the Above/Not Elevated Risk    BMI: 21.7  BP: 120/80  ATP III score: 1  Prostate Cancer Screening   Digital Rectal Exam (DRE): Does not apply                  Prostate Specific Antigen (PSA): No applicable    Colon Cancer Screening   Colonoscopy results: No Colonoscopy documented    Referrals  Referral to Risk Reduction/Health Educator: Konrad Felix    RN Case Reviewer:  Governor Rooks RN    Referred to Nurse Manager :  Governor Rooks [facility])

## 2007-08-31 ENCOUNTER — Telehealth (HOSPITAL_BASED_OUTPATIENT_CLINIC_OR_DEPARTMENT_OTHER): Payer: Self-pay | Admitting: Registered Nurse

## 2007-11-14 ENCOUNTER — Other Ambulatory Visit (HOSPITAL_BASED_OUTPATIENT_CLINIC_OR_DEPARTMENT_OTHER): Payer: Self-pay | Admitting: Family Medicine

## 2007-11-14 ENCOUNTER — Ambulatory Visit (HOSPITAL_BASED_OUTPATIENT_CLINIC_OR_DEPARTMENT_OTHER): Payer: PRIVATE HEALTH INSURANCE | Admitting: Lab

## 2007-11-14 DIAGNOSIS — Z13220 Encounter for screening for lipoid disorders: Principal | ICD-10-CM

## 2007-11-14 DIAGNOSIS — D649 Anemia, unspecified: Secondary | ICD-10-CM

## 2007-11-14 DIAGNOSIS — Z833 Family history of diabetes mellitus: Secondary | ICD-10-CM

## 2007-11-14 LAB — BLOOD COUNT COMPLETE AUTOMATED
HEMATOCRIT: 38 % (ref 36.0–48.0)
HEMOGLOBIN: 12.8 g/dl (ref 12.0–16.0)
MEAN CORP HGB CONC: 33.7 g/dl (ref 32.0–36.0)
MEAN CORPUSCULAR HGB: 29.7 pg (ref 27.0–33.0)
MEAN CORPUSCULAR VOL: 88 fl (ref 80.0–100.0)
MEAN PLATELET VOLUME: 9.3 fl (ref 6.4–10.8)
PLATELET COUNT: 302 10*3/uL (ref 150–400)
RBC DISTRIBUTION WIDTH: 13.2 % (ref 11.5–14.3)
RED BLOOD CELL COUNT: 4.32 M/uL — ABNORMAL LOW (ref 4.50–5.10)
WHITE BLOOD CELL COUNT: 6.7 10*3/uL (ref 4.0–10.8)

## 2007-11-14 LAB — CHG LIPID PANEL
Cholesterol: 173 mg/dl (ref 0–200)
HIGH DENSITY LIPOPROTEIN: 61 mg/dl (ref 35–85)
LOW DENSITY LIPOPROTEIN DIRECT: 90 mg/dl (ref 0–100)
RISK FACTOR: 2.8 (ref ?–4.4)
TRIGLYCERIDES: 96 mg/dl (ref 0–150)

## 2007-11-14 LAB — TRANSFERASE ASPARTATE AMINO AST SGOT: ASPARTATE AMINOTRANSFERASE: 26 IU/L (ref 8–34)

## 2007-11-14 LAB — GLUCOSE FASTING: GLUCOSE FASTING: 93 mg/dl (ref 74–118)

## 2007-11-14 LAB — TRANSFERASE ALANINE AMINO ALT SGPT: ALANINE AMINOTRANSFERASE: 19 IU/L (ref 7–35)

## 2007-11-14 NOTE — Progress Notes (Signed)
Lab draw

## 2007-11-18 ENCOUNTER — Ambulatory Visit (HOSPITAL_BASED_OUTPATIENT_CLINIC_OR_DEPARTMENT_OTHER): Payer: PRIVATE HEALTH INSURANCE | Admitting: Family Medicine

## 2007-11-18 VITALS — BP 102/62 | HR 99 | Temp 98.2°F | Ht 61.0 in | Wt 112.0 lb

## 2007-11-18 DIAGNOSIS — J3089 Other allergic rhinitis: Secondary | ICD-10-CM

## 2007-11-18 DIAGNOSIS — J301 Allergic rhinitis due to pollen: Secondary | ICD-10-CM | POA: Insufficient documentation

## 2007-11-18 DIAGNOSIS — R87810 Cervical high risk human papillomavirus (HPV) DNA test positive: Secondary | ICD-10-CM

## 2007-11-18 DIAGNOSIS — L570 Actinic keratosis: Secondary | ICD-10-CM

## 2007-11-18 DIAGNOSIS — Z0000 Encounter for general adult medical examination without abnormal findings: Principal | ICD-10-CM

## 2007-11-18 MED ORDER — CVS ENTERIC ASPIRIN 81 MG OR TBEC
DELAYED_RELEASE_TABLET | ORAL | Status: DC
Start: 2007-11-18 — End: 2008-10-24

## 2007-11-18 MED ORDER — FLUTICASONE PROPIONATE 50 MCG/ACT NA SUSP
NASAL | Status: DC
Start: 2007-11-18 — End: 2008-10-24

## 2007-11-18 NOTE — Progress Notes (Signed)
Chief Complaint:  Linda Rodriguez is a 57 year old female who presents for a physical exam.   Patient also has other current concerns as detailed below.  I have reviewed the patient's medical history in detail and updated the computerized patient record.  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]         Date Noted: 08/10/2004    HYPERLIPIDEMIA NEC/NOS [272.4]         Date Noted: 08/10/2004         LDL 194             MENOPAUSE  2001 [256.3]         Date Noted: 08/10/2004         Strong FH osteoporosis; bone densitometry ordered          2007    Medications at the end of today's visit are:  GLUCOSAMINE CHONDROITIN COMPLX OR TABS, 1500/1200 one daily;  B COMPLEX VITAMINS OR TABS, one daily;  CALCIUM 600-D 600-400 MG-UNIT OR TABS, 1 tablet by mouth twice a day to prevent osteoporosis;  OTHER MEDICATION, takes ciclofibrate from [place] 100 mg every other day;  ATORVASTATIN CALCIUM 10 MG OR TABS, 1 TABLET every other day (buys in [place]);  [facility]  ENTERIC ASPIRIN 81 MG OR TBEC, 1 TABLET DAILY  FLUTICASONE PROPIONATE 50 MCG/ACT NA SUSP, 1 spray in each side of nose daily;  VITAMIN D 400 UNIT OR TABS, 1 tablet by mouth daily   Allergies: Propoxyphene, Diclofenac Potassium    Immunization History    Hep B,Adult dose      03/27/2004  04/27/2004  10/26/2004      PPD                   07/01/2004      TD  03/23/1994  07/03/2004      Past Medical History    menopause 2001 08/10/2004    MITRAL VALVE DISORDER 08/10/2004    Comment: History of rheumatic fever Echo 9/01 mild mitral regurgitation    CALCULUS OF KIDNEY 08/10/2004    HYPERLIPIDEMIA NEC/NOS 08/10/2004    Comment: LDL 194         Past Surgical History    NIPPLE/AREOLA RECONSTRUCTION 1995    Comment: breast reduction         Social History   Marital Status: Single  Spouse Name: N/A    Years of Education: N/A  Number of Children: N/A     Occupational History  translator UNEMPLOYED      Social History Main Topics   Tobacco Use: Never    Alcohol Use: Yes  0 oz/week     Comment: less than weekly     Drug Use: No    Sexually Active: Yes  Partner(s): Female     Other Topics Concern    Military Service No    Blood Transfusions No    Caffeine Concern No    Occupational Exposure No    Sleep Concern No    Stress Concern No    Weight Concern No    Special Diet No    Exercise Yes    Comment: in [place], walked; needs a companion to walk    [employer] Yes    Self-Exams Yes     Social History Narrative   None on file        Family History    Heart Father    Comment: MI age 1    Osteoporosis Mother    Asthma Sister    Asthma Brother    Diabetes Brother    Tree surgeon Abuse Brother    Cataracts Father    Hypertension Sister    Hypertension Brother    Diabetes Brother    Cancer - Breast FamHxNeg    Cancer - Colon Sister         Review of Systems:                   Skin: see below  Eyes: negative  Ears/Nose/Throat: see below  Respiratory: negative  Cardiovascular: negative  Gastrointestinal: negative  Genitourinary:  negative  Gyn:no breast pain or new or enlarging lumps on self exam, no vaginal bleeding and no discharge or pelvic pain  Musculoskeletal: negative  Neurologic: negative  Endocrine: negative  Psychiatric: negative  Hematologic/Lymphatic/Immunologic: negative    Physical:  BP 102/62   Pulse 99   Temp (Src) 98.2 F (36.8 C) (Oral)   Ht 5\' 1"  (1.549 m)   Wt 112 lb (50.803 kg)   LMP Postmenopausal  Body mass index is 21.16 kg/(m^2).  General appearance: healthy, alert, well developed, well nourished  Eyes: negative, PERLA, EOM's full  Skin: skin color, texture, turgor are normal  Head: Normocephalic. No masses, lesions, tenderness or abnormalities  Ears: External ears normal. Canals clear. TM's normal.  Nose/Sinuses: negative  Oropharynx: Lips, mucosa, and tongue normal. Teeth and gums normal. Oropharynx moist and without lesion  Neck: supple. No adenopathy. Thyroid symmetric, normal size, and without nodularity  Back:  symmetric, no curvature.   Lungs:  chest symmetric with normal A/P diameter, no chest deformities noted, normal respiratory rate and rhythm, lungs clear to auscultation  Heart: regular rate and rhythm, S1 normal, S2 normal, no murmurs, clicks, or gallops  Breast: symmetric, no dominant or suspicious mass, no  skin or nipple changes, no axillary adenopathy.   Breast self exam is taught and encouraged  Abdomen:  soft, non-tender. No masses, no organomegaly  Extremities:  normal. No deformities, edema, or skin discoloration  Musculoskeletal:  Muscular strength intact.  Neuro: mental status intact, cranial nerves 2-12 intact, muscle tone normal, normal gait   Pelvic:  external genitalia normal, vaginal mucosa atrophic, cervix clear, normal sized uterus, adnexae not felt  Rectal: deferred    Health Counseling:  Smoking:  Reviewed and Discussed  Substance Use Issues:   Reviewed and Discussed   Seat Belts:  Reviewed and Discussed  Diet, Exercise, Wt. Control:  Reviewed and Discussed  Dental Health:  Reviewed  and Discussed  Vision Health:  Reviewed and Discussed  Mental Health:  Reviewed and Discussed  Domestic Violence:  Reviewed and Discussed  Sexual Health:  Reviewed and Discussed  BSE:  Reviewed and Discussed  Osteoporosis: Reviewed and Discussed     Screening:  Pap  And HPV done  Mammogram due September, outreach letter given  Colorectal cancer screening reviewed guidelines UpToDate, given age at which sister was diagnosed (>70) still meets criteria for q 10 year screening      ASSESSMENT/PLAN:    V70.0 Routine General Medical Examination at a Health Care Facility  (primary encounter diagnosis)  Plan: [facility] ENTERIC ASPIRIN 81 MG OR TBEC      477.8 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  Comment: Currently living in an apartment with a lot of books, has had mild nasal congestion and postnasal drip daily  OBJECTIVE:  Nares mildly congested, no polyps  ASSESSMENT:  ? Dust allergy  Plan: FLUTICASONE PROPIONATE 50 MCG/ACT NA SUSP        Therapeutic trial    702.0 Actinic Keratosis  Comment: MD relative in [place] advised freezing some lesions. I see patches of dry skin, ? 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

## 2007-11-18 NOTE — Patient Instructions (Signed)
[facility], [place]. [address] [place] [address], Tel. [phone]  Drs. Mathews Robinsons and Rodney Langton, [place]. [address] [place] [phone]  After scheduling an appointment, please call and leave a message with my medical assistant, Carollee Sires,  with the following information:  Name of doctor you will be seeing  Date of visit.    Linda Rodriguez and I can then process a managed care referral and send a note to the dermatologist with any important clinical information.

## 2007-11-21 LAB — HUMAN PAPILLOMAVIRUS (HPV)
HUMAN PAPILLOMAVIRUS: POSITIVE
ORGANISM: HIGH — AB

## 2007-11-28 LAB — CYTOPATH, C/V, THIN LAYER

## 2007-12-08 ENCOUNTER — Encounter (HOSPITAL_BASED_OUTPATIENT_CLINIC_OR_DEPARTMENT_OTHER): Payer: Self-pay | Admitting: Family Medicine

## 2007-12-11 ENCOUNTER — Telehealth (HOSPITAL_BASED_OUTPATIENT_CLINIC_OR_DEPARTMENT_OTHER): Payer: Self-pay | Admitting: Family Medicine

## 2007-12-11 DIAGNOSIS — N952 Postmenopausal atrophic vaginitis: Principal | ICD-10-CM

## 2007-12-11 NOTE — Telephone Encounter (Addendum)
HPV positive  Normal colpo one year ago  Will advise repeat colpo, but would like to prescibe a course of vaginal estrogens first to make it easier. We can use the same Estrace cream she used before if she agrees.  Check pharmacy and re-order as e-prescribe as needed.

## 2007-12-20 ENCOUNTER — Ambulatory Visit (HOSPITAL_BASED_OUTPATIENT_CLINIC_OR_DEPARTMENT_OTHER): Payer: PRIVATE HEALTH INSURANCE

## 2007-12-20 MED ORDER — ESTRACE 0.1 MG/GM VA CREA
TOPICAL_CREAM | VAGINAL | Status: DC
Start: 2007-12-20 — End: 2008-01-05

## 2007-12-20 NOTE — Telephone Encounter (Signed)
Left  Voicemail to call us back

## 2007-12-25 ENCOUNTER — Ambulatory Visit (HOSPITAL_BASED_OUTPATIENT_CLINIC_OR_DEPARTMENT_OTHER): Payer: Self-pay

## 2007-12-25 NOTE — Telephone Encounter (Signed)
PPD read at [facility] on Fri 12/22/07 at NEG 0mm.

## 2007-12-27 ENCOUNTER — Telehealth (HOSPITAL_BASED_OUTPATIENT_CLINIC_OR_DEPARTMENT_OTHER): Payer: Self-pay

## 2007-12-27 NOTE — Telephone Encounter (Signed)
TC to pt at work and cell numbers,no answer, L/m to call back.

## 2007-12-27 NOTE — Telephone Encounter (Signed)
Staff Message copied by Eliseo Gum on Wed Dec 27, 2007 10:43 AM  ------   Message from: Mardelle Matte   Created: Wed Dec 27, 2007 9:43 AM   Regarding: returning nurse call    Contact: [phone]    ERICAH SCOTTO [id number], 57 year old, female, Telephone Information:  Home Phone [phone]  Work Phone [phone] ext [phone]  Mobile [phone]      Cleotis Lema NUMBER: [phone] ext [phone]  Best time to call back: you can call Pt's at work or at her cell phone  Cell phone:   Other phone:    Available times:    Patient's language of care: English    Patient does not need an interpreter.    Patient's PCP: Tonette Lederer MD    Person calling on behalf of patient: Patient (self)    Calls today Returning phone call     Patient's Preferred Pharmacy:   [facility] [address] [place]  Phone: [phone] Fax: [phone]

## 2007-12-28 ENCOUNTER — Telehealth (HOSPITAL_BASED_OUTPATIENT_CLINIC_OR_DEPARTMENT_OTHER): Payer: Self-pay

## 2007-12-28 NOTE — Telephone Encounter (Signed)
Staff Message copied by Gaynelle Arabian on Thu Dec 28, 2007 10:47 AM  ------   Message from: [place], [place]   Created: Thu Dec 28, 2007 9:14 AM   Regarding: returning call   Contact: [phone]    Linda Rodriguez [id number], 57 year old, female, Telephone Information:  Home Phone [phone]  Work Phone Not on file.  Mobile [phone]      CALL BACK [phone]  Best time to call back:   Cell phone:   Other phone:    Available times:    Patient's language of care: English    Patient does not need an interpreter.    Patient's PCP: Tonette Lederer MD    Person calling on behalf of patient: Patient (self)    Calls today Returning phonecall you can reach her at [phone]    Patient's Preferred Pharmacy:   [facility] [address] [place]  Phone: [phone] Fax: [phone]

## 2007-12-28 NOTE — Telephone Encounter (Signed)
PC to pt at her work number as requested by her today.  Left voicemail that we have been trying to reach her since Sept 30th and to please call us today.  See message below from DR Tyler Deis , Sept 30th

## 2007-12-28 NOTE — Telephone Encounter (Signed)
Tonette Lederer MD Wed Dec 20, 2007 2:58 PM Addended   HPV positive   Normal colpo one year ago   Will advise repeat colpo, but would like to prescibe a course of vaginal estrogens first to make it easier. We can use the same Estrace cream she used before if she agrees.   Check pharmacy and re-order as e-prescribe as needed.

## 2008-01-03 NOTE — Telephone Encounter (Signed)
Staff Message copied by Eliseo Gum on Wed Jan 03, 2008 3:26 PM  ------   Message from: Rolanda Jay   Created: Wed Jan 03, 2008 3:08 PM   Regarding: Returning Phone Call   Contact: [phone]    Linda Rodriguez [id number], 57 year old, female, Telephone Information:  Home Phone [phone]  Work Phone Not on file.  Mobile [phone]      Linda Rodriguez NUMBER: [phone]  Best time to call back: Anytime  Cell phone:   Other phone:    Available times:    Patient's language of care: English    Patient does not need an interpreter.    Patient's PCP: Tonette Lederer MD    Person calling on behalf of patient: Patient (self)    Calls today Returning phonecall  Pt is calling requesting to speak with [facility] the nurse when you have a chance in regards to her phone call from last week.    Thank you  Patient's Preferred Pharmacy:   [facility] [address] [place]  Phone: [phone] Fax: [phone]

## 2008-01-03 NOTE — Telephone Encounter (Signed)
TC to pt ,no answer,L/M to call again.

## 2008-01-04 ENCOUNTER — Telehealth (HOSPITAL_BASED_OUTPATIENT_CLINIC_OR_DEPARTMENT_OTHER): Payer: Self-pay

## 2008-01-04 DIAGNOSIS — N952 Postmenopausal atrophic vaginitis: Principal | ICD-10-CM

## 2008-01-04 NOTE — Telephone Encounter (Signed)
See encounter 9/21. Third attempt to reach pt about  Message below. Left message for pt to call us back      Tonette Lederer MD Wed Dec 20, 2007 2:58 PM Addended   HPV positive   Normal colpo one year ago   Will advise repeat colpo, but would like to prescibe a course of vaginal estrogens first to make it easier. We can use the same Estrace cream she used before if she agrees.   Check pharmacy and re-order as e-prescribe as needed.

## 2008-01-05 MED ORDER — ESTRACE 0.1 MG/GM VA CREA
TOPICAL_CREAM | VAGINAL | Status: AC
Start: 2008-01-05 — End: 2008-01-06

## 2008-01-05 NOTE — Telephone Encounter (Signed)
Pt calls returning our phone calls. I told her that we have been trying to contact her b/c her HPV test was positive and Dr. Tyler Deis would like her to have another colposcopy. Pt agrees and appt scheduled with dr. Jackelyn Knife on 01/20/08.Will forward to Dr. Tyler Deis to E-scribe the estrace vag cream.

## 2008-01-09 ENCOUNTER — Other Ambulatory Visit (HOSPITAL_BASED_OUTPATIENT_CLINIC_OR_DEPARTMENT_OTHER): Payer: Self-pay

## 2008-01-09 DIAGNOSIS — M47817 Spondylosis without myelopathy or radiculopathy, lumbosacral region: Principal | ICD-10-CM

## 2008-01-09 DIAGNOSIS — R252 Cramp and spasm: Secondary | ICD-10-CM

## 2008-01-09 DIAGNOSIS — M1990 Unspecified osteoarthritis, unspecified site: Secondary | ICD-10-CM

## 2008-01-09 MED ORDER — RELAFEN 500 MG PO TABS
ORAL_TABLET | ORAL | Status: DC
Start: 2008-01-09 — End: 2008-10-24

## 2008-01-09 MED ORDER — LIDODERM 5 % EX PTCH
MEDICATED_PATCH | CUTANEOUS | Status: AC
Start: 2008-01-09 — End: 2008-02-09

## 2008-01-09 MED ORDER — OMEPRAZOLE 20 MG PO CPDR
DELAYED_RELEASE_CAPSULE | ORAL | Status: AC
Start: 2008-01-09 — End: 2008-02-09

## 2008-01-09 MED ORDER — DICLOFENAC SODIUM 75 MG PO TBEC
DELAYED_RELEASE_TABLET | ORAL | Status: DC
Start: 2008-01-09 — End: 2008-01-09

## 2008-01-09 MED ORDER — CYCLOBENZAPRINE HCL 10 MG PO TABS
ORAL_TABLET | ORAL | Status: AC
Start: 2008-01-09 — End: 2008-01-16

## 2008-01-15 ENCOUNTER — Encounter (HOSPITAL_BASED_OUTPATIENT_CLINIC_OR_DEPARTMENT_OTHER): Payer: Self-pay

## 2008-01-15 NOTE — Progress Notes (Signed)
Left form on provider's mailbox to fill out.

## 2008-01-20 ENCOUNTER — Ambulatory Visit (HOSPITAL_BASED_OUTPATIENT_CLINIC_OR_DEPARTMENT_OTHER): Payer: PRIVATE HEALTH INSURANCE | Admitting: Family Medicine

## 2008-01-20 VITALS — BP 120/74 | HR 74 | Temp 97.2°F | Resp 18 | Ht 61.0 in | Wt 111.0 lb

## 2008-01-20 DIAGNOSIS — R87810 Cervical high risk human papillomavirus (HPV) DNA test positive: Principal | ICD-10-CM

## 2008-01-20 NOTE — Progress Notes (Signed)
PATIENT/PROCEDURE VERIFICATION DOCUMENTATION    Correct patient: Yes  Correct procedure: Yes  Correct side, site, mark visible if applicable: Yes  Correct position: Yes  Special equipment/implant(s) present, if applicable: Yes    Time-out completed, documented by provider doing procedure or designated team member:  Kirby Crigler MD    01/20/2008    21:38 PM65 year old female presents for colposcopy  No LMP date recorded. Reason: Postmenopausal.     PCP: Tonette Lederer MD    Patient pregnant? No  Risk factors: HPV  Pap smear 1 months ago showed: normal.  Prior pap(s) showed normal.   Prior colposcopy: Yes, Date: 2007   Prior cervical treatment: expectant management.      Details of the procedure, indications, risks and follow up were discussed with the patient, questions answered and verbal consent obtained.    PROCEDURE:  Cervix was visualized and swabbed with 3% acetic acid.  The transformation zone  was completely visualized, appeared normal, extended in to the canal and seen with endocervical speculum.    FINDINGS:  Cervix: no visible lesions    Pap smear: not obtained    ECC: Done    Biopsy: Not Done    CLINICAL IMPRESSION:normal exam without visible pathology  Examination was satisfactory    DISPOSITION:  Repeat cytology in 12 months if nl ECC

## 2008-01-23 LAB — SURGICAL PATH SPECIMEN

## 2008-01-25 ENCOUNTER — Encounter (HOSPITAL_BASED_OUTPATIENT_CLINIC_OR_DEPARTMENT_OTHER): Payer: Self-pay

## 2008-01-25 NOTE — Progress Notes (Signed)
PA on LIDODERM was denied please let me know if i should contact  patient notifying he/she of denial, i will have medical record personnel scan denial documents with more details about denial.

## 2008-02-07 ENCOUNTER — Encounter (HOSPITAL_BASED_OUTPATIENT_CLINIC_OR_DEPARTMENT_OTHER): Payer: Self-pay | Admitting: Family Medicine

## 2008-02-09 ENCOUNTER — Encounter (HOSPITAL_BASED_OUTPATIENT_CLINIC_OR_DEPARTMENT_OTHER): Payer: Self-pay | Admitting: Family Medicine

## 2008-02-21 ENCOUNTER — Ambulatory Visit (HOSPITAL_BASED_OUTPATIENT_CLINIC_OR_DEPARTMENT_OTHER): Payer: Self-pay | Admitting: Family Medicine

## 2008-02-22 LAB — MA SCREENING MAMMO BILATERAL WITH CAD

## 2008-10-23 ENCOUNTER — Ambulatory Visit (HOSPITAL_BASED_OUTPATIENT_CLINIC_OR_DEPARTMENT_OTHER): Payer: BLUE CROSS/BLUE SHIELD | Admitting: Family Medicine

## 2008-10-23 ENCOUNTER — Inpatient Hospital Stay (HOSPITAL_BASED_OUTPATIENT_CLINIC_OR_DEPARTMENT_OTHER)
Admission: RE | Admit: 2008-10-23 | Disposition: A | Discharge status: Home / Self Care | Payer: Self-pay | Source: Emergency Department | Attending: Emergency Medicine | Admitting: Emergency Medicine

## 2008-10-23 ENCOUNTER — Encounter (HOSPITAL_BASED_OUTPATIENT_CLINIC_OR_DEPARTMENT_OTHER): Payer: Self-pay

## 2008-10-23 ENCOUNTER — Encounter (HOSPITAL_BASED_OUTPATIENT_CLINIC_OR_DEPARTMENT_OTHER): Payer: Self-pay | Admitting: Family Medicine

## 2008-10-23 VITALS — BP 106/64 | HR 94 | Temp 97.1°F | Ht 61.0 in | Wt 114.2 lb

## 2008-10-23 DIAGNOSIS — R079 Chest pain, unspecified: Principal | ICD-10-CM

## 2008-10-23 NOTE — Progress Notes (Signed)
Cc: chest pain    Last night woke with left sided chest pain. Had similar pain 1 week before at night also.  Lasted less than 2 minutes both times. Before that never had chest pain. Last week started new exercise program this week. Had to inflate exercise ball. First few days that used it seemed to make her nauseated. Doing abdominal exercises. Feels she is deconditioned. Also walking, 20 minutes a few times per week, not feel pain when exercising. Pain felt like a squeezing pressure, not too strong. 4-5/10. No shortness of breath, but also had strange feeling jaw at same time (sleeps with nightgaurd, grinds), also felt a similar pain in the upper back at the same time. No palpitations or diaphoresis assoc.    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]     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 [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.8]    O: vitals noted  Pleasant, well appearing, no apparent distress  Mucous membranes moist, anicteric  Regular rate and rhythm no murmurs rubs or gallops  Clear to auscultation bilaterally  Nl bs, soft, non-tender, non-distended, no hepatosplenomegaly  No clubbing cyanosis or edema    ekg appears normal sinus rhythm    A/p: 786.49F Chest Pain  (primary encounter diagnosis)  Comment:with radiation, woke her from sleep, 58 yo and +fam hx, and lipids  Plan: EKG ** DONE BY NURSE OR MD **        i think she needs to be evaluated by er for acute mi, against my clear advice, she refuses to go right  now, insisting on finishing her work day, agrees to go to er after work in approx 1.5 hours, agrees to call ambulance if pain returns before then. If r/o should have stress test as outpatient.      Dr.thum

## 2008-10-23 NOTE — ED Notes (Signed)
Bed:RA5<BR> Expected date:10/23/08<BR> Expected time: 3:53 PM<BR> Means of arrival:Self<BR> Comments:<BR> JENNALYN, POZO  MRN [id number]    CC: CHEST PAIN SINCE LAST NIGHT RADIATING TO JAW.  NL EKG IN OFFICE    58 YO F FH OF CAD    PMH: HL    DR. Conley Canal.  ADMIT TO HOSPITALIST

## 2008-10-23 NOTE — ED Notes (Signed)
Pt states she is here for a special blood test

## 2008-10-24 LAB — BLOOD COUNT COMPLETE AUTO&AUTO DIFRNTL WBC
BASOPHIL %: 1.4 % (ref 0.0–2.0)
EOSINOPHIL %: 2.1 % (ref 0.0–7.0)
HEMATOCRIT: 37.2 % (ref 36.0–48.0)
HEMOGLOBIN: 13.1 g/dl (ref 12.0–16.0)
LYMPHOCYTE %: 37.9 % (ref 13.0–39.0)
MEAN CORP HGB CONC: 35.3 g/dl (ref 32.0–36.0)
MEAN CORPUSCULAR HGB: 31.1 pg (ref 27.0–33.0)
MEAN CORPUSCULAR VOL: 88.1 fl (ref 80.0–100.0)
MEAN PLATELET VOLUME: 8.7 fl (ref 6.4–10.8)
MONOCYTE %: 9.2 % (ref 1.0–12.0)
NEUTROPHIL %: 49.4 % (ref 46.0–79.0)
PLATELET COUNT: 317 10*3/uL (ref 150–400)
RBC DISTRIBUTION WIDTH: 13.1 % (ref 11.5–14.3)
RED BLOOD CELL COUNT: 4.22 M/uL — ABNORMAL LOW (ref 4.50–5.10)
WHITE BLOOD CELL COUNT: 8.7 10*3/uL (ref 4.0–10.8)

## 2008-10-24 LAB — COMPREHENSIVE METABOLIC PANEL
ALANINE AMINOTRANSFERASE: 32 IU/L (ref 7–35)
ALBUMIN: 4 g/dl (ref 3.4–4.8)
ALKALINE PHOSPHATASE: 48 IU/L (ref 25–106)
ANION GAP: 8 mmol/L (ref 2–25)
ASPARTATE AMINOTRANSFERASE: 33 IU/L (ref 8–34)
BILIRUBIN TOTAL: 0.6 mg/dl (ref 0.2–1.1)
BUN (UREA NITROGEN): 17 mg/dl (ref 6–20)
CALCIUM: 9.6 mg/dl (ref 8.6–10.3)
CARBON DIOXIDE: 28 mmol/L (ref 22–32)
CHLORIDE: 101 mmol/L (ref 101–111)
CREATININE: 0.8 mg/dl (ref 0.4–1.2)
ESTIMATED GLOMERULAR FILT RATE: >60 mL/min (ref >60)
Glucose Random: 108 mg/dl (ref 74–160)
POTASSIUM: 4 mmol/L (ref 3.5–5.1)
SODIUM: 137 mmol/L (ref 135–144)
TOTAL PROTEIN: 7.1 g/dl (ref 5.9–7.5)

## 2008-10-24 LAB — BLOOD COUNT COMPLETE AUTOMATED
HEMATOCRIT: 38.4 % (ref 36.0–48.0)
HEMOGLOBIN: 13.1 g/dl (ref 12.0–16.0)
MEAN CORP HGB CONC: 34.2 g/dl (ref 32.0–36.0)
MEAN CORPUSCULAR HGB: 30.4 pg (ref 27.0–33.0)
MEAN CORPUSCULAR VOL: 89 fl (ref 80.0–100.0)
MEAN PLATELET VOLUME: 8.3 fl (ref 6.4–10.8)
PLATELET COUNT: 306 10*3/uL (ref 150–400)
RBC DISTRIBUTION WIDTH: 13.3 % (ref 11.5–14.3)
RED BLOOD CELL COUNT: 4.32 M/uL — ABNORMAL LOW (ref 4.50–5.10)
WHITE BLOOD CELL COUNT: 8.1 10*3/uL (ref 4.0–10.8)

## 2008-10-24 LAB — CK-MB PROFILE
CK INDEX: 1.1 %
CK INDEX: 1 %
CK INDEX: 0.9 %
CKMB: 1.9 ng/mL (ref 0.0–4.0)
CKMB: 1.7 ng/mL (ref 0.0–4.0)
CKMB: 1.3 ng/mL (ref 0.0–4.0)
CREATINE KINASE TOTAL: 170 IU/L (ref 21–215)
CREATINE KINASE TOTAL: 167 IU/L (ref 21–215)
CREATINE KINASE TOTAL: 142 IU/L (ref 21–215)

## 2008-10-24 LAB — PROTHROMBIN TIME
INR: 1 — ABNORMAL LOW (ref 2.0–3.5)
PROTHROMBIN TIME: 10.1 SECONDS (ref 9.5–11.5)

## 2008-10-24 LAB — H&P

## 2008-10-24 LAB — EKG

## 2008-10-24 LAB — TROPONIN I
TROPONIN I: 0.02 ng/mL (ref 0.00–0.04)
TROPONIN I: 0.03 ng/mL (ref 0.00–0.04)
TROPONIN I: 0.02 ng/mL (ref 0.00–0.04)

## 2008-10-24 LAB — BASIC METABOLIC PANEL
ANION GAP: 9 mmol/L (ref 2–25)
BUN (UREA NITROGEN): 16 mg/dl (ref 6–20)
CALCIUM: 9.2 mg/dl (ref 8.6–10.3)
CARBON DIOXIDE: 26 mmol/L (ref 22–32)
CHLORIDE: 104 mmol/L (ref 101–111)
CREATININE: 0.8 mg/dl (ref 0.4–1.2)
ESTIMATED GLOMERULAR FILT RATE: >60 mL/min (ref >60)
Glucose Random: 90 mg/dl (ref 74–160)
POTASSIUM: 4.1 mmol/L (ref 3.5–5.1)
SODIUM: 139 mmol/L (ref 135–144)

## 2008-10-24 LAB — EMERGENCY ROOM NOTE

## 2008-10-24 LAB — CARDIAC STRESS TEST: EXERCISE/TREADMILL

## 2008-10-24 LAB — XR CHEST 2 VIEWS

## 2008-10-24 LAB — NM NUCLEAR PERFUSION STRESS TEST - COMPLETE

## 2008-10-24 LAB — APTT: APTT: 28 SECONDS (ref 23.7–33.3)

## 2008-10-25 ENCOUNTER — Encounter (HOSPITAL_BASED_OUTPATIENT_CLINIC_OR_DEPARTMENT_OTHER): Payer: Self-pay

## 2008-10-25 LAB — EKG

## 2008-10-25 NOTE — Telephone Encounter (Signed)
TC to pt ,no answer, L/M to call me back. Pt has a f/u appt with Dr. Tyler Deis on 10/30/08.

## 2008-10-30 ENCOUNTER — Ambulatory Visit (HOSPITAL_BASED_OUTPATIENT_CLINIC_OR_DEPARTMENT_OTHER): Payer: HMO | Admitting: Family Medicine

## 2008-10-30 ENCOUNTER — Encounter (HOSPITAL_BASED_OUTPATIENT_CLINIC_OR_DEPARTMENT_OTHER): Payer: Self-pay | Admitting: Family Medicine

## 2008-10-30 VITALS — BP 112/60 | HR 86 | Temp 97.7°F | Ht 61.0 in | Wt 114.5 lb

## 2008-10-30 DIAGNOSIS — IMO0002 Reserved for concepts with insufficient information to code with codable children: Principal | ICD-10-CM

## 2008-10-30 DIAGNOSIS — R0789 Other chest pain: Secondary | ICD-10-CM

## 2008-11-01 DIAGNOSIS — R0789 Other chest pain: Secondary | ICD-10-CM | POA: Insufficient documentation

## 2008-11-01 NOTE — Progress Notes (Signed)
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 SHe had a standard rule out MI evaluation and ETT with imaging. ETT was read as inconclusive due to baseline ST T changes although all her EKG's were read as normal. I did personally review her EKGs today and find some very subtle ST segment depression and/or straightening in a few leads but would concur with the normal reading. In any case, the imaging was normal.    The chest pain occurred after a new exercise regimen at the gym suspicious for causing new chest wall pain.    In terms of risk factors, the biggest flag is a history of a questionable TIA in 2001. I note that records pertaining to this are not available in the electronic record and will see if we can retrieve them. At that time estrogens were stopped and she has been on aspirin since.    Mild MR on echo almost 10 years ago noted with ? Rheumatic fever as a child. She has no symptoms whatsoever of DOE and we discussed repeating the echo should these ever occur.      Cholesterol (mg/dl)   Date     Date  Value    11/14/2007  173    ----------    LDL (mg/dl)   Date     Date  Value    11/14/2007  90    ----------    HDL (mg/dl)   Date     Date  Value    11/14/2007  61    ----------    TRIGLYCERIDES (mg/dl)   Date     Date  Value    11/14/2007  96    ----------      Family History    Heart Father    Comment: MI age 26    Osteoporosis Mother    Asthma Sister    Asthma Brother    Diabetes Brother    Tree surgeon Abuse Brother    Cataracts Father    Hypertension Sister    Hypertension Brother    Diabetes Brother    Cancer - Breast FamHxNeg    Cancer - Colon Sister         Tobacco Use: Never           Alcohol Use: Yes           0.0 oz/week       Comment: less than weekly    She has felt well since discharge and has a number of questions about medical terms she heard used while in the hospital.    OBJECTIVE:  Looks well, not  anxious  BP 112/60   Pulse 86   Temp(Src) 97.7 F (36.5 C) (Temporal)   Ht 5\' 1"  (1.549 m)   Wt 114 lb 8 oz (51.937 kg)   SpO2 98%  S1 and S2 normal, no murmurs, clicks, gallops or rubs. Regular rate and rhythm. Chest is clear; no wheezes or rales. No edema or JVD.  Carotids normal no bruits  No specific chest wall tenderness    ASSESSMENT:  Liley chest wall pain resolved  Hx mild MR    PLAN:  She will schedule fasting lipids and follow up with me for her annual PE  >50% of 30 minute visit counseling: interpretation of test results

## 2008-11-13 ENCOUNTER — Ambulatory Visit (HOSPITAL_BASED_OUTPATIENT_CLINIC_OR_DEPARTMENT_OTHER): Payer: HMO | Admitting: Lab

## 2008-11-13 DIAGNOSIS — Z833 Family history of diabetes mellitus: Secondary | ICD-10-CM

## 2008-11-13 DIAGNOSIS — Z13220 Encounter for screening for lipoid disorders: Principal | ICD-10-CM

## 2008-11-13 DIAGNOSIS — IMO0002 Reserved for concepts with insufficient information to code with codable children: Secondary | ICD-10-CM

## 2008-11-13 NOTE — Progress Notes (Signed)
Labs drawn

## 2008-11-20 ENCOUNTER — Encounter (HOSPITAL_BASED_OUTPATIENT_CLINIC_OR_DEPARTMENT_OTHER): Payer: Self-pay | Admitting: Family Medicine

## 2008-11-20 ENCOUNTER — Ambulatory Visit (HOSPITAL_BASED_OUTPATIENT_CLINIC_OR_DEPARTMENT_OTHER): Payer: HMO | Admitting: Family Medicine

## 2008-11-20 VITALS — BP 110/68 | HR 79 | Temp 98.4°F | Ht 61.0 in | Wt 116.1 lb

## 2008-11-20 DIAGNOSIS — E785 Hyperlipidemia, unspecified: Secondary | ICD-10-CM

## 2008-11-20 DIAGNOSIS — E559 Vitamin D deficiency, unspecified: Secondary | ICD-10-CM

## 2008-11-20 DIAGNOSIS — Z139 Encounter for screening, unspecified: Secondary | ICD-10-CM

## 2008-11-20 DIAGNOSIS — Z0000 Encounter for general adult medical examination without abnormal findings: Principal | ICD-10-CM

## 2008-11-20 LAB — CHG LIPID PANEL
Cholesterol: 174 mg/dl (ref 0–200)
HIGH DENSITY LIPOPROTEIN: 64 mg/dl (ref 35–85)
LOW DENSITY LIPOPROTEIN DIRECT: 98 mg/dl (ref 0–100)
RISK FACTOR: 2.7 (ref ?–4.4)
TRIGLYCERIDES: 75 mg/dl (ref 0–150)

## 2008-11-20 LAB — ALANINE AMINOTRANSFERASE: ALANINE AMINOTRANSFERASE: 23 IU/L (ref 7–35)

## 2008-11-20 LAB — CBC WITH PLATELET
HEMATOCRIT: 37.8 % (ref 36.0–48.0)
HEMOGLOBIN: 12.9 g/dl (ref 12.0–16.0)
MEAN CORP HGB CONC: 34.2 g/dl (ref 32.0–36.0)
MEAN CORPUSCULAR HGB: 30.3 pg (ref 27.0–33.0)
MEAN CORPUSCULAR VOL: 88.7 fl (ref 80.0–100.0)
MEAN PLATELET VOLUME: 9.5 fl (ref 6.4–10.8)
PLATELET COUNT: 299 10*3/uL (ref 150–400)
RBC DISTRIBUTION WIDTH: 13.4 % (ref 11.5–14.3)
RED BLOOD CELL COUNT: 4.26 M/uL — ABNORMAL LOW (ref 4.50–5.10)
WHITE BLOOD CELL COUNT: 7.2 10*3/uL (ref 4.0–10.8)

## 2008-11-20 LAB — HOLD PURPLE TOP TUBE

## 2008-11-20 LAB — GLUCOSE FASTING: GLUCOSE FASTING: 83 mg/dl (ref 74–118)

## 2008-11-20 LAB — ASPARTATE AMINOTRANSFERASE: ASPARTATE AMINOTRANSFERASE: 30 IU/L (ref 8–34)

## 2008-11-20 LAB — VITAMIN D,25 HYDROXY: VITAMIN D,25 HYDROXY: 28.2 ng/ml — ABNORMAL LOW (ref 30.0–100.0)

## 2008-11-20 MED ORDER — VITAMIN D 1000 UNITS PO TABS
ORAL_TABLET | ORAL | Status: DC
Start: 2008-11-20 — End: 2010-04-08

## 2008-11-21 LAB — HUMAN PAPILLOMAVIRUS (HPV): HUMAN PAPILLOMAVIRUS: NEGATIVE

## 2008-11-25 NOTE — Progress Notes (Signed)
Chief Complaint:  Linda Rodriguez is a 58 year old female who presents for a physical exam.   Patient also has other current concerns as detailed below.  I have reviewed the patient's medical history in detail and updated the computerized patient record.  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 Baltazar Apo "ECC            inadequate but not suspicious clinically for            abnormality; follow one year pap as planned."            3/09 Pap insufficient cells, HPV negative            9/09 Pap normal HPV positive repeat colpo after            topical estrogens to             facilitate ECC normal            Plan repeat cytology 01/18/09      FAMILY HX DIABETES MELLITUS [V18.0]         Date Noted: 08/25/2005      ATROPHIC VAGINITIS [627.3]         Date Noted: 08/25/2005            Replens      MITRAL VALVE DISORDER [424.0]         Date Noted: 08/10/2004            History of rheumatic fever            Echo 9/01 mild mitral regurgitation      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]         Date Noted: 08/10/2004      HYPERLIPIDEMIA NEC/NOS [272.4]         Date Noted: 08/10/2004            LDL 194                  MENOPAUSE  2001 [256.3]         Date Noted: 08/10/2004            Strong FH osteoporosis; bone densitometry ordered            2007      Medications at the end of  today's visit  are:  PX ENTERIC ASPIRIN 81 MG OR TBEC, 1 tablet by mouth daily;  B COMPLEX VITAMINS OR TABS, one daily;  GLUCOSAMINE CHONDR 1500 COMPLX OR CAPS, 1 tablet by mouth daily;  ATORVASTATIN CALCIUM 10 MG OR TABS, one every other day;  OTHER MEDICATION, ciprofibrate 100 mg from [place] (a fibric acid)  one every other day;  CALCIUM 600-D 600-400 MG-UNIT OR TABS, 1 tablet by mouth twice a day to prevent osteoporosis;  VITAMIN D 1000 UNIT OR TABS, 1 TABLET DAILY  DISCONTD: B COMPLEX VITAMINS OR TABS, one daily;  DISCONTD: ATORVASTATIN CALCIUM 10 MG OR TABS, 1 TABLET every other day (buys in [place])  Allergies: Propoxyphene, Diclofenac Potassium    Immunization History  Administered            Date(s) Administered    Hep B,Adult dose      03/27/2004  04/27/2004  10/26/2004      PPD                   07/01/2004  12/20/2007      TD                    03/23/1994  07/03/2004      Past Medical History    menopause 2001 08/10/2004    MITRAL VALVE DISORDER 08/10/2004    Comment: History of rheumatic fever Echo 9/01 mild mitral regurgitation    CALCULUS OF KIDNEY 08/10/2004    HYPERLIPIDEMIA NEC/NOS 08/10/2004    Comment: LDL 194         Past Surgical History    NIPPLE/AREOLA RECONSTRUCTION 1995    Comment breast reduction         Social History   Marital Status: Single  Spouse Name: N/A    Years of Education: N/A  Number of Children: N/A     Occupational History  translator UNEMPLOYED      Social History Main Topics   Tobacco Use: Never    Alcohol Use: Yes  0.0 oz/week     Comment: less than weekly    Drug Use: No    Sexually Active: Yes  Partner(s): Female     Other Topics Concern    Military Service No    Blood Transfusions No    Caffeine Concern No    Occupational Exposure No    Sleep Concern No    Stress Concern No    Weight Concern No    Special Diet No    Exercise Yes    Comment: in [place], walked; needs a companion to walk    [employer] Yes    Self-Exams Yes     Social History Narrative   None on file        Family  History    Heart Father    Comment: MI age 43    Osteoporosis Mother    Asthma Sister    Asthma Brother    Diabetes Brother    Tree surgeon Abuse Brother    Cataracts Father    Hypertension Sister    Hypertension Brother    Diabetes Brother    Cancer - Breast FamHxNeg    Cancer - Colon Sister         Review of Systems:                   Skin: negative  Eyes: negative  Ears/Nose/Throat: negative  Respiratory: negative  Cardiovascular: negative  Gastrointestinal: negative  Genitourinary: negative  Gyn:no breast  pain or new or enlarging lumps on self exam and no vaginal bleeding  Musculoskeletal: negative  Neurologic: negative  Endocrine: negative  Psychiatric: negative  Hematologic/Lymphatic/Immunologic: negative    Physical:  BP 110/68   Pulse 79   Temp(Src) 98.4 F (36.9 C) (Oral)   Ht 5\' 1"  (1.549 m)   Wt 116 lb 2 oz (52.674 kg)   SpO2 97%  Body mass index is 21.94 kg/(m^2).  General appearance: healthy, alert, well developed, well nourished  Eyes: negative, PERLA, EOM's full  Skin: skin color, texture, turgor are normal  Head: Normocephalic. No masses, lesions, tenderness or abnormalities  Ears: External ears normal. Canals clear. TM's normal.  Nose/Sinuses: negative  Oropharynx: Lips, mucosa, and tongue normal. Teeth and gums normal. Oropharynx moist and without lesion  Neck: supple. No adenopathy. Thyroid symmetric, normal size, and without nodularity  Back:  symmetric, no curvature.   Lungs:  chest symmetric with normal A/P diameter, no chest deformities noted, normal respiratory rate and rhythm, lungs clear to auscultation  Heart: regular rate and rhythm, S1 normal, S2 normal, no murmurs, clicks, or gallops  Breast: symmetric, no dominant or suspicious mass, no skin or nipple changes, no axillary adenopathy.   Breast self exam is taught and encouraged  Abdomen:  soft, non-tender. No masses, no organomegaly  Extremities:  normal. No deformities, edema, or skin discoloration  Musculoskeletal:  Muscular strength  intact.  Neuro: mental status intact, cranial nerves 2-12 intact, muscle tone normal, normal gait   Pelvic:  external genitalia , vaginal mucosa atrophic changes, cervix clear, normal sized uterus, adnexae negative  Rectal: deferred    Health Counseling:  Smoking:  Reviewed and Discussed  Substance Use Issues:   Reviewed and Discussed   Seat Belts:  Reviewed and Discussed  Diet, Exercise, Wt. Control:  Reviewed and Discussed  Dental Health:  Reviewed and Discussed  Vision Health:  Reviewed and Discussed  Mental Health:  Reviewed and Discussed  Domestic Violence:  Reviewed and Discussed  Sexual Health:  Reviewed and Discussed  BSE:  Reviewed and Discussed  Osteoporosis: Reviewed and Discussed     Screening:  Pap    Mammogram   Colorectal cancer screening up to date      ASSESSMENT/PLAN:    V70.0 Routine General Medical Examination at a Health Care Facility  (primary encounter diagnosis)      268.9G Vitamin D Deficiency  Comment:   VITAMIN D,25-HYDROXY (ng/ml)   Date     Date  Value    11/20/2008  28.2*   ----------    Plan: increase supplementation    272.4 HYPERLIPIDEMIA NEC/NOS  Comment: Uses meds from [place] low dose atorvastatin and fibrate  Component      Latest Ref Rng 11/20/2008   CHOLESTEROL      0 - 200 mg/dl 960   TRIGLYCERIDES      0 - 150 mg/dl 75   HDL      35 - 85 mg/dl 64   LDL-DIRECT      0 - 100 mg/dl 98   RISK FACTOR      High: 4.4 2.7   GLUCOSE - FASTING      74 - 118 mg/dl 83   VITAMIN D, 45-WUJWJXB      30.0 - 100.0 ng/ml 28.2 (L)   AST      8 - 34 IU/L 30   ALT      7 - 35 IU/L 23     Plan:  No change in therapy    V82.9A Screening    Plan: LAB ADD ON TEST, CYTOPATH, C/V, THIN LAYER,         HUMAN PAPILLOMAVIRUS

## 2008-11-28 LAB — CYTOPATH, C/V, THIN LAYER

## 2008-12-03 ENCOUNTER — Encounter (HOSPITAL_BASED_OUTPATIENT_CLINIC_OR_DEPARTMENT_OTHER): Payer: Self-pay | Admitting: Family Medicine

## 2008-12-03 NOTE — Progress Notes (Addendum)
Quick Note:    Return to routine screening, letter sent  ______

## 2009-01-18 ENCOUNTER — Encounter (HOSPITAL_BASED_OUTPATIENT_CLINIC_OR_DEPARTMENT_OTHER): Payer: Self-pay | Admitting: Family Medicine

## 2009-02-10 ENCOUNTER — Encounter (HOSPITAL_BASED_OUTPATIENT_CLINIC_OR_DEPARTMENT_OTHER): Payer: Self-pay | Admitting: Family Medicine

## 2009-02-24 ENCOUNTER — Ambulatory Visit: Payer: Self-pay | Admitting: Family Medicine

## 2009-02-25 LAB — MA SCREENING MAMMO BILATERAL WITH CAD

## 2009-03-18 ENCOUNTER — Ambulatory Visit: Payer: Self-pay | Admitting: Family Medicine

## 2009-03-26 ENCOUNTER — Encounter (HOSPITAL_BASED_OUTPATIENT_CLINIC_OR_DEPARTMENT_OTHER): Payer: Self-pay | Admitting: Family Medicine

## 2009-03-31 LAB — MA DIAGNOSTIC MAMMO UNILATERAL LEFT WITH CAD

## 2009-04-29 ENCOUNTER — Ambulatory Visit (HOSPITAL_BASED_OUTPATIENT_CLINIC_OR_DEPARTMENT_OTHER): Payer: HMO | Admitting: Ophthalmology

## 2009-04-29 ENCOUNTER — Encounter (HOSPITAL_BASED_OUTPATIENT_CLINIC_OR_DEPARTMENT_OTHER): Payer: Self-pay | Admitting: Ophthalmology

## 2009-04-29 DIAGNOSIS — H04129 Dry eye syndrome of unspecified lacrimal gland: Secondary | ICD-10-CM

## 2009-04-29 DIAGNOSIS — H5200 Hypermetropia, unspecified eye: Secondary | ICD-10-CM

## 2009-04-29 DIAGNOSIS — H524 Presbyopia: Secondary | ICD-10-CM

## 2009-04-29 DIAGNOSIS — H01009 Unspecified blepharitis unspecified eye, unspecified eyelid: Principal | ICD-10-CM

## 2009-04-29 HISTORY — DX: Presbyopia: H52.4

## 2009-04-29 NOTE — Progress Notes (Signed)
Impression:  Blepharitis/Dry eye syndrome  Hyperopia/Presbyopia    Plan:  Warm compress/lid hygiene/Tobrex ophthalmic ung q hs when symptomatic  Tears OU prn  Rx given for spectacle lenses  See 1 year.

## 2009-04-29 NOTE — Nursing Note (Signed)
>>   Linda Rodriguez     Tue Apr 29, 2009  8:37 AM  Here 70yr exam, eyes are watery with thick tears, OS>OD. Last year when it happened lasted for 1 month. Now happens occasionally. Wears PAL's no complaints.

## 2009-05-02 ENCOUNTER — Encounter (HOSPITAL_BASED_OUTPATIENT_CLINIC_OR_DEPARTMENT_OTHER): Payer: Self-pay | Admitting: Family Medicine

## 2009-05-02 ENCOUNTER — Ambulatory Visit (HOSPITAL_BASED_OUTPATIENT_CLINIC_OR_DEPARTMENT_OTHER): Payer: HMO | Admitting: Family Medicine

## 2009-05-02 VITALS — BP 112/64 | HR 92 | Temp 98.5°F | Ht 61.25 in | Wt 115.5 lb

## 2009-05-02 DIAGNOSIS — J320 Chronic maxillary sinusitis: Principal | ICD-10-CM

## 2009-05-02 MED ORDER — AUGMENTIN XR 1000-62.5 MG PO TB12
ORAL_TABLET | ORAL | Status: AC
Start: 2009-05-02 — End: 2009-05-12

## 2009-05-02 NOTE — Patient Instructions (Signed)
Nasal lavage is a way to help with nasal congestion from colds and allergies.  In a neti pot (a neti pot looks sort of like a watering can, with a spout) or some other container, place 8 oz of warmish water with 1 teaspoon of non-iodized salt.  Dissolve the salt and then pour the salt water in one nostril.  Repeat in the other nostril.     Try combination of over-the-counter decongestant and antihistamine

## 2009-05-02 NOTE — Progress Notes (Signed)
SUBJECTIVE:  2 weeks of cough, maxillary and frontal sinus congestion, dental pain, malaise, fatigue  Initially got better then symptoms recurred 5 days ago  No fever, no facial swelling  No myalgias, chest pain or dyspnea  Has tried sudafed and Nyquil to help sleep  Non-smoker  Sick contacts: works as Artist, face-to-face  Has recently noted mold in her bathroom and thinks she smells it in her bedroom    OBJECTIVE:  BP 112/64  Pulse 92  Temp(Src) 98.5 F (36.9 C) (Temporal)  Ht 5' 1.25" (1.556 m)  Wt 115 lb 8 oz (52.39 kg)  SpO2 98%  Gen: well-appearing, cooperative, comfortable, well-groomed  Ears: external structures normal, canals clear, tympanic membranes pearly grey with normal landmarks bilaterally   Nose: nasal passages patent with clear nasal discharge  Oropharynx: mucus membranes moist, no lesions of lips, tongue or gums, posterior oropharynx clear without erythema, lesions, or exudate  Sinus: mild bilateral maxillary sinus tenderness to palpation, no facial swelling or erythema  Neck: supple, no lymphadenopathy, no thyromegaly   Lungs: clear to auscultation bilaterally  Heart: Regular rate and rhythm, normal S1 and S2, no murmurs, rubs or gallop    A/P:  473.0A Maxillary sinusitis  (primary encounter diagnosis)  Comment: acute  Plan:   -Explained that at this point could be viral or bacterial in etiology; discussed pros and cons of antibiotics and advised giving antibiotics to hold for use if no improvement within next 4 days or if develops fever, swelling or localized pain. Patient amenable, script given for augmentin.  -increase fluids, try neti pot, try OTC decongestant + antihistamine  -Return to clinic if persistent or if any new or worsening symptoms

## 2009-05-06 ENCOUNTER — Ambulatory Visit (HOSPITAL_BASED_OUTPATIENT_CLINIC_OR_DEPARTMENT_OTHER): Payer: HMO

## 2010-01-21 ENCOUNTER — Other Ambulatory Visit (HOSPITAL_BASED_OUTPATIENT_CLINIC_OR_DEPARTMENT_OTHER): Payer: Self-pay | Admitting: Family Medicine

## 2010-01-21 DIAGNOSIS — J3089 Other allergic rhinitis: Principal | ICD-10-CM

## 2010-01-21 MED ORDER — FLUTICASONE PROPIONATE 50 MCG/ACT NA SUSP
1.0000 | Freq: Every day | NASAL | Status: AC
Start: 2010-01-21 — End: 2011-01-21

## 2010-01-21 NOTE — Telephone Encounter (Signed)
Linda Rodriguez is a 59 year old female has requested a refill of Flonase.  Other Med Adult:  Most Recent BP Reading(s)  05/02/09 : 112/64        Cholesterol (mg/dl)   Date     Date  Value    11/20/2008  174    ----------    LDL (mg/dl)   Date     Date  Value    11/20/2008  98    ----------    HDL (mg/dl)   Date     Date  Value    11/20/2008  64    ----------    TRIGLYCERIDES (mg/dl)   Date     Date  Value    11/20/2008  75    ----------        THYROID SCREEN TSH (uIU/ml)   Date     Date  Value    03/05/2002  0.68    ----------        No results found for this basename: TSH:1      No results found for this basename: hgba1c:1        INR (no units)   Date     Date  Value    10/23/2008  1.0*    07/14/2001  0.9*   ----------       Documented patient preferred [facility]ies:  [facility] [address] [place] [phone] Fax: [phone]

## 2010-03-17 ENCOUNTER — Encounter (HOSPITAL_BASED_OUTPATIENT_CLINIC_OR_DEPARTMENT_OTHER): Payer: Self-pay | Admitting: Family Medicine

## 2010-03-30 ENCOUNTER — Ambulatory Visit (HOSPITAL_BASED_OUTPATIENT_CLINIC_OR_DEPARTMENT_OTHER): Payer: HMO | Admitting: Ophthalmology

## 2010-03-30 ENCOUNTER — Encounter (HOSPITAL_BASED_OUTPATIENT_CLINIC_OR_DEPARTMENT_OTHER): Payer: Self-pay | Admitting: Ophthalmology

## 2010-03-30 DIAGNOSIS — H01009 Unspecified blepharitis unspecified eye, unspecified eyelid: Secondary | ICD-10-CM

## 2010-03-30 DIAGNOSIS — H00019 Hordeolum externum unspecified eye, unspecified eyelid: Principal | ICD-10-CM

## 2010-03-30 MED ORDER — CEPHALEXIN 500 MG PO CAPS
500.00 mg | ORAL_CAPSULE | Freq: Three times a day (TID) | ORAL | Status: AC
Start: 2010-03-30 — End: 2010-04-06

## 2010-03-30 NOTE — Nursing Note (Signed)
>>   Linda Rodriguez     Mon Mar 30, 2010 11:15 AM  RLL is swollen and red x 2 days including the 'right eye bag'. It hurts especially when looking up.  Va is good cc, still happy with old Rx.  F/u Blepharitis/Dry eye syndrome  Hyperopia/Presbyopia

## 2010-04-01 ENCOUNTER — Telehealth (HOSPITAL_BASED_OUTPATIENT_CLINIC_OR_DEPARTMENT_OTHER): Payer: Self-pay | Admitting: Family Medicine

## 2010-04-01 DIAGNOSIS — E785 Hyperlipidemia, unspecified: Secondary | ICD-10-CM

## 2010-04-01 DIAGNOSIS — Z833 Family history of diabetes mellitus: Principal | ICD-10-CM

## 2010-04-01 DIAGNOSIS — R0789 Other chest pain: Secondary | ICD-10-CM

## 2010-04-01 DIAGNOSIS — E559 Vitamin D deficiency, unspecified: Secondary | ICD-10-CM

## 2010-04-01 DIAGNOSIS — N200 Calculus of kidney: Secondary | ICD-10-CM

## 2010-04-03 ENCOUNTER — Other Ambulatory Visit (HOSPITAL_BASED_OUTPATIENT_CLINIC_OR_DEPARTMENT_OTHER): Payer: HMO

## 2010-04-03 DIAGNOSIS — E785 Hyperlipidemia, unspecified: Principal | ICD-10-CM

## 2010-04-03 DIAGNOSIS — R0789 Other chest pain: Secondary | ICD-10-CM

## 2010-04-03 DIAGNOSIS — Z833 Family history of diabetes mellitus: Secondary | ICD-10-CM

## 2010-04-03 DIAGNOSIS — E559 Vitamin D deficiency, unspecified: Secondary | ICD-10-CM

## 2010-04-03 LAB — CBC WITH PLATELET
HEMATOCRIT: 38.2 % (ref 36.0–48.0)
HEMOGLOBIN: 13 g/dl (ref 12.0–16.0)
MEAN CORP HGB CONC: 34.1 g/dl (ref 32.0–36.0)
MEAN CORPUSCULAR HGB: 30.4 pg (ref 27.0–33.0)
MEAN CORPUSCULAR VOL: 89.1 fl (ref 80.0–100.0)
MEAN PLATELET VOLUME: 10.2 fl (ref 6.4–10.8)
PLATELET COUNT: 283 10*3/uL (ref 150–400)
RBC DISTRIBUTION WIDTH: 13.5 % (ref 11.5–14.3)
RED BLOOD CELL COUNT: 4.29 M/uL — ABNORMAL LOW (ref 4.50–5.10)
WHITE BLOOD CELL COUNT: 7.5 10*3/uL (ref 4.0–10.8)

## 2010-04-04 LAB — HELICOBACTER PYLORI STOOL AG

## 2010-04-08 ENCOUNTER — Encounter (HOSPITAL_BASED_OUTPATIENT_CLINIC_OR_DEPARTMENT_OTHER): Payer: Self-pay | Admitting: Family Medicine

## 2010-04-08 ENCOUNTER — Ambulatory Visit (HOSPITAL_BASED_OUTPATIENT_CLINIC_OR_DEPARTMENT_OTHER): Payer: HMO | Admitting: Family Medicine

## 2010-04-08 VITALS — BP 118/60 | HR 78 | Temp 97.1°F | Ht 61.25 in | Wt 116.5 lb

## 2010-04-08 DIAGNOSIS — R7689 Other specified abnormal immunological findings in serum: Secondary | ICD-10-CM

## 2010-04-08 DIAGNOSIS — Z23 Encounter for immunization: Secondary | ICD-10-CM

## 2010-04-08 DIAGNOSIS — E785 Hyperlipidemia, unspecified: Secondary | ICD-10-CM

## 2010-04-08 DIAGNOSIS — E559 Vitamin D deficiency, unspecified: Secondary | ICD-10-CM

## 2010-04-08 DIAGNOSIS — Z0000 Encounter for general adult medical examination without abnormal findings: Principal | ICD-10-CM

## 2010-04-08 DIAGNOSIS — R768 Other specified abnormal immunological findings in serum: Secondary | ICD-10-CM

## 2010-04-08 DIAGNOSIS — Z124 Encounter for screening for malignant neoplasm of cervix: Secondary | ICD-10-CM

## 2010-04-09 LAB — COMP METABOLIC PANEL, FASTING
ALANINE AMINOTRANSFERASE: 24 IU/L (ref 7–35)
ALBUMIN: 4.1 g/dl (ref 3.4–4.8)
ALKALINE PHOSPHATASE: 61 IU/L (ref 25–106)
ANION GAP: 8 mmol/L (ref 2–25)
ASPARTATE AMINOTRANSFERASE: 31 IU/L (ref 8–34)
BILIRUBIN TOTAL: 0.6 mg/dl (ref 0.2–1.1)
BUN (UREA NITROGEN): 11 mg/dl (ref 6–20)
CALCIUM: 9.7 mg/dl (ref 8.6–10.3)
CARBON DIOXIDE: 28 mmol/L (ref 22–32)
CHLORIDE: 104 mmol/L (ref 101–111)
CREATININE: 1 mg/dl (ref 0.4–1.2)
ESTIMATED GLOMERULAR FILT RATE: 57 mL/min — ABNORMAL LOW (ref >60)
GLUCOSE FASTING: 96 mg/dl (ref 74–118)
POTASSIUM: 4.9 mmol/L (ref 3.5–5.1)
SODIUM: 140 mmol/L (ref 135–144)
TOTAL PROTEIN: 7.4 g/dl (ref 5.9–7.5)

## 2010-04-09 LAB — CHG LIPID PANEL
Cholesterol: 169 mg/dl (ref 0–200)
HIGH DENSITY LIPOPROTEIN: 68 mg/dl (ref 35–85)
LOW DENSITY LIPOPROTEIN DIRECT: 90 mg/dl (ref 0–100)
RISK FACTOR: 2.5 (ref ?–4.4)
TRIGLYCERIDES: 61 mg/dl (ref 0–150)

## 2010-04-09 LAB — HUMAN PAPILLOMAVIRUS (HPV): HUMAN PAPILLOMAVIRUS: NEGATIVE

## 2010-04-09 LAB — HELICOBACTER PYLORI IGG: HELICOBACTER PYLORI IGG: POSITIVE — AB

## 2010-04-09 LAB — VITAMIN D,25 HYDROXY: VITAMIN D,25 HYDROXY: 26.7 ng/ml — ABNORMAL LOW (ref 30.0–100.0)

## 2010-04-13 LAB — CYTOPATH, C/V, THIN LAYER

## 2010-04-13 NOTE — Progress Notes (Signed)
Chief Complaint:  Linda Rodriguez is a 60 year old female who presents for a physical exam.   Patient also has other current concerns as detailed below.  I have reviewed the patient's medical history in detail and updated the computerized patient record.  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 Baltazar Apo "ECC            inadequate but not suspicious clinically for            abnormality; follow one year pap as planned."            3/09 Pap insufficient cells, HPV negative            9/09 Pap normal HPV positive repeat colpo after            topical estrogens to             facilitate ECC normal            Pap 11/2008 WNL HPV negative, return to routine            screening      FAMILY HX DIABETES MELLITUS [V18.0]         Date Noted: 08/25/2005      ATROPHIC VAGINITIS [627.3]         Date Noted: 08/25/2005            Replens      MITRAL VALVE DISORDER [424.0]         Date Noted: 08/10/2004            History of rheumatic fever            Echo 9/01 mild mitral regurgitation      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 1 spray in each side of nose daily during allergy season;  PX ENTERIC ASPIRIN 81 MG OR TBEC, 1 tablet by mouth daily;  ATORVASTATIN CALCIUM 10 MG OR TABS, one every other day;  OTHER MEDICATION, ciprofibrate 100 mg from [place] (a fibric acid)  one every other day;  CALCIUM 600-D 600-400 MG-UNIT OR TABS, 1 tablet by mouth twice a day to prevent osteoporosis  Cholecalciferol (VITAMIN D) 2000 UNIT CAPS, Take 1 capsule by mouth daily.  Allergies: Propoxyphene, Diclofenac Potassium    Immunization History  Administered            Date(s) Administered    Hepatitis B           03/27/2004  04/27/2004  10/26/2004      PPD                   07/01/2004  12/20/2007      TD                    03/23/1994  07/03/2004      Past Medical History    menopause 2001 08/10/2004    MITRAL VALVE DISORDER 08/10/2004    Comment: History of rheumatic fever Echo 9/01 mild mitral regurgitation    Calculus of kidney 08/10/2004    HYPERLIPIDEMIA NEC/NOS 08/10/2004    Comment: LDL 194    Presbyopia 04/29/2009         Past Surgical History    NIPPLE/AREOLA RECONSTRUCTION 1995    Comment breast reduction         Social History   Marital Status: Single  Spouse Name: N/A    Years of Education: N/A  Number of Children: N/A     Occupational History  translator UNEMPLOYED      Social History Main Topics   Smoking status: Never Smoker     Smokeless tobacco:     Alcohol Use: Yes  0.0 oz/week     Comment: less than weekly    Drug Use: No    Sexually Active: Not Currently  Partner(s): Female     Other Topics Concern    Military Service No    Blood Transfusions No    Caffeine Concern No    Occupational Exposure No    Sleep Concern No    Stress Concern No    Weight Concern No    Special Diet No    Exercise Yes    Comment: zumba    Seat Belt Yes    Self-Exams Yes     Social History Narrative   None on file        Family History    Heart Father      Comment: MI age 77    Osteoporosis Mother     Asthma Sister     Asthma Brother     Diabetes Brother     Tree surgeon Abuse Brother     Cataracts Father     Hypertension Sister     Hypertension Brother     Diabetes Brother     Cancer - Breast FamHxNeg     Cancer - Colon Sister          Review of Systems:                   Skin: negative  Eyes: negative  Ears/Nose/Throat: negative  Respiratory: negative  Cardiovascular: negative  Gastrointestinal: negative  Genitourinary: negative  Gyn:no breast pain or new or enlarging lumps on self exam and no vaginal bleeding  Musculoskeletal: no joint pain since starting regular vigorous exercise  Neurologic: negative  Endocrine: negative  Psychiatric: negative  Hematologic/Lymphatic/Immunologic: negative    Physical:  BP 118/60   Pulse 78   Temp(Src) 97.1 F (36.2 C) (Temporal)   Ht 5' 1.25" (1.556 m)   Wt 116 lb 8 oz (52.844 kg)   BMI 21.83 kg/m2  Body mass index is 21.83 kg/(m^2).  General appearance: healthy, alert, well developed, well nourished  Eyes: negative, PERLA, EOM's full  Skin: skin color, texture, turgor are normal  Head: Normocephalic. No masses, lesions, tenderness or abnormalities  Ears: External ears normal. Canals clear. TM's normal.  Nose/Sinuses: negative  Oropharynx: Lips, mucosa, and tongue normal. Teeth and gums normal. Oropharynx moist and without lesion  Neck: supple. No adenopathy. Thyroid symmetric, normal size, and without nodularity  Back:  symmetric, no curvature.   Lungs:  chest symmetric with normal A/P diameter, no chest deformities noted, normal respiratory rate and rhythm, lungs clear to auscultation  Heart: regular rate and rhythm, S1 normal, S2 normal, no murmurs, clicks, or gallops  Breast: symmetric, no dominant or suspicious mass, no skin or nipple changes, no axillary adenopathy.   Breast self exam is taught and encouraged  Abdomen:  soft, non-tender. No masses, no organomegaly  Extremities:  normal. No deformities, edema, or skin  discoloration  Musculoskeletal:  Muscular strength intact.  Neuro: mental status intact, cranial nerves 2-12 intact, muscle tone normal, normal gait   Pelvic:  external genitalia normal, vaginal mucosa normal, cervix clear, normal sized uterus, adnexae negative  Rectal: WNL    Health Counseling:  Smoking:  Reviewed and Discussed  Substance Use Issues:   Reviewed and Discussed   Diet, Exercise, Wt. Control:  Reviewed and Discussed  Dental Health:  Reviewed and Discussed  Vision Health:  Reviewed and Discussed  Mental Health:  Reviewed and Discussed  Domestic Violence:  Reviewed and Discussed  Sexual Health:  Reviewed and Discussed  BSE:  Reviewed and Discussed  Osteoporosis: Reviewed and Discussed     Screening:  Pap  completed  Mammogram scheduled  Colorectal cancer screening up to date      ASSESSMENT/PLAN:    V70.0 Routine general medical examination at a health care facility  (primary encounter diagnosis  V76.2 Screening for malignant neoplasm of the cervix  Comment: she prefers to repeat today given previous HPV  Plan: CYTOPATH, C/V, THIN LAYER, HUMAN PAPILLOMAVIRUS           272.4 HYPERLIPIDEMIA NEC/NOS  Comment:   LDL (mg/dl)   Date     Date  Value    04/03/2010  90    ----------      plan: No change in therapy      268.9G Vitamin D deficiency  Comment: taking 1,000 U daily    VITAMIN D,25-HYDROXY (ng/ml)   Date     Date  Value    04/03/2010  26.7*   ----------    Plan: Increase to 2,000 units    795.79P Helicobacter pylori ab+  Comment: Because of intermittent heartburn, she requested H Pylori IgG which was positive  Follow up with stool H pylori Ag was negative  Plan: No indication for Rx    See scanned VAR.  Checked on patient after 20 minutes, no adverse reactions.

## 2010-04-15 ENCOUNTER — Encounter: Payer: Self-pay | Admitting: Family Medicine

## 2010-04-20 ENCOUNTER — Other Ambulatory Visit: Payer: Self-pay | Admitting: Family Medicine

## 2010-04-20 LAB — MA SCREENING MAMMO BILATERAL WITH CAD

## 2010-04-24 ENCOUNTER — Encounter (HOSPITAL_BASED_OUTPATIENT_CLINIC_OR_DEPARTMENT_OTHER): Payer: Self-pay | Admitting: Family Medicine

## 2010-09-18 ENCOUNTER — Ambulatory Visit (HOSPITAL_BASED_OUTPATIENT_CLINIC_OR_DEPARTMENT_OTHER): Payer: HMO | Admitting: Ophthalmology

## 2010-09-18 DIAGNOSIS — H524 Presbyopia: Secondary | ICD-10-CM

## 2010-09-18 DIAGNOSIS — H5200 Hypermetropia, unspecified eye: Principal | ICD-10-CM

## 2010-09-18 NOTE — Nursing Note (Signed)
>>   Linda Rodriguez     Fri Sep 18, 2010 11:53 AM  Patient here for annual examination  No complaints.

## 2010-09-18 NOTE — Progress Notes (Signed)
Impression:  Hyperopia/Presbyopia    Plan:  Rx given for spectacle lenses  Reassured of ocular health  See 1 year.

## 2010-12-08 ENCOUNTER — Encounter (HOSPITAL_BASED_OUTPATIENT_CLINIC_OR_DEPARTMENT_OTHER): Payer: Self-pay

## 2011-01-01 ENCOUNTER — Encounter (HOSPITAL_BASED_OUTPATIENT_CLINIC_OR_DEPARTMENT_OTHER): Payer: Self-pay | Admitting: Family Medicine

## 2011-01-01 ENCOUNTER — Telehealth (HOSPITAL_BASED_OUTPATIENT_CLINIC_OR_DEPARTMENT_OTHER): Payer: Self-pay

## 2011-01-01 DIAGNOSIS — Z833 Family history of diabetes mellitus: Principal | ICD-10-CM

## 2011-01-01 NOTE — Telephone Encounter (Signed)
Has standing orders. I reviewed them and sent her a [employer] message.

## 2011-01-01 NOTE — Telephone Encounter (Signed)
TC to this pt who has a PE scheduled with Dr. Tyler Deis on 01/27/11 and is asking if Dr. Tyler Deis could place future orders for her labs so that she can have them drawn prior to her appt .She states that she works over at [employer] and would like to have her labs drawn there. Pt advised that I will forward her request to Dr. Tyler Deis.

## 2011-01-01 NOTE — Telephone Encounter (Signed)
Message copied by Eliseo Gum on Fri Jan 01, 2011 10:31 AM  ------       Message from: Georjean Mode, Joelyn Oms       Created: Fri Jan 01, 2011 10:13 AM       Regarding: pt have question about lab        Contact: [phone]         [facility]              Linda Rodriguez [id number], 60 year old, female, Telephone Information:       Home Phone      [phone]       Work Phone      [phone]       Mobile          [phone]                     Cleotis Lema NUMBER: [phone]       Best time to call back: anytime       Cell phone:        Other phone:              Available times:              Patient's language of care: English              Patient does not need an interpreter.              Patient's PCP: Tonette Lederer MD              Person calling on behalf of patient: Patient (self)              Calls today to speak to nurse only. Pt want to know  if the Dr can put her future lab before she have her physical done.               Patient's Preferred Pharmacy:        [facility]              Phone: [phone] Fax: [phone]

## 2011-01-09 ENCOUNTER — Encounter (HOSPITAL_BASED_OUTPATIENT_CLINIC_OR_DEPARTMENT_OTHER): Payer: Self-pay | Admitting: Family Medicine

## 2011-01-18 ENCOUNTER — Other Ambulatory Visit (HOSPITAL_BASED_OUTPATIENT_CLINIC_OR_DEPARTMENT_OTHER): Payer: Self-pay | Admitting: Family Medicine

## 2011-01-18 ENCOUNTER — Ambulatory Visit: Payer: Self-pay | Admitting: Family Medicine

## 2011-01-18 DIAGNOSIS — R7309 Other abnormal glucose: Secondary | ICD-10-CM

## 2011-01-18 DIAGNOSIS — E559 Vitamin D deficiency, unspecified: Secondary | ICD-10-CM

## 2011-01-18 DIAGNOSIS — E785 Hyperlipidemia, unspecified: Principal | ICD-10-CM

## 2011-01-18 LAB — CBC WITH PLATELET
HEMATOCRIT: 40.4 % (ref 34.1–44.9)
HEMOGLOBIN: 13.2 g/dL (ref 11.2–15.7)
MEAN CORP HGB CONC: 32.7 g/dL (ref 31.0–37.0)
MEAN CORPUSCULAR HGB: 28.8 pg (ref 26.0–34.0)
MEAN CORPUSCULAR VOL: 88.2 fL (ref 80.0–100.0)
MEAN PLATELET VOLUME: 10.6 fL (ref 8.7–12.5)
PLATELET COUNT: 304 10*3/uL (ref 150–400)
RBC DISTRIBUTION WIDTH STD DEV: 41.9 fL (ref 35.1–46.3)
RBC DISTRIBUTION WIDTH: 13.2 % (ref 11.5–14.3)
RED BLOOD CELL COUNT: 4.58 M/uL (ref 3.90–5.20)
WHITE BLOOD CELL COUNT: 6.9 10*3/uL (ref 4.0–11.0)

## 2011-01-19 DIAGNOSIS — R7309 Other abnormal glucose: Secondary | ICD-10-CM | POA: Insufficient documentation

## 2011-01-19 LAB — HEMOGLOBIN A1C
ESTIMATED AVERAGE GLUCOSE: 123 (ref 74–160)
HEMOGLOBIN A1C: 5.9 % — ABNORMAL HIGH (ref 4.0–5.6)

## 2011-01-20 LAB — CHG LIPID PANEL
Cholesterol: 192 mg/dl (ref 0–200)
HIGH DENSITY LIPOPROTEIN: 73 mg/dl (ref 35–85)
LOW DENSITY LIPOPROTEIN DIRECT: 96 mg/dl (ref 0–100)
RISK FACTOR: 2.6 (ref ?–4.4)
TRIGLYCERIDES: 93 mg/dl (ref 0–150)

## 2011-01-20 LAB — VITAMIN D,25 HYDROXY: VITAMIN D,25 HYDROXY: 44.5 ng/ml (ref 30.0–100.0)

## 2011-01-27 ENCOUNTER — Encounter (HOSPITAL_BASED_OUTPATIENT_CLINIC_OR_DEPARTMENT_OTHER): Payer: Self-pay | Admitting: Family Medicine

## 2011-01-27 ENCOUNTER — Ambulatory Visit (HOSPITAL_BASED_OUTPATIENT_CLINIC_OR_DEPARTMENT_OTHER): Payer: HMO | Admitting: Family Medicine

## 2011-01-27 VITALS — BP 148/72 | HR 75 | Temp 98.3°F | Ht 61.25 in | Wt 115.0 lb

## 2011-01-27 DIAGNOSIS — R21 Rash and other nonspecific skin eruption: Secondary | ICD-10-CM

## 2011-01-27 DIAGNOSIS — L309 Dermatitis, unspecified: Secondary | ICD-10-CM

## 2011-01-27 DIAGNOSIS — L659 Nonscarring hair loss, unspecified: Secondary | ICD-10-CM

## 2011-01-27 DIAGNOSIS — R030 Elevated blood-pressure reading, without diagnosis of hypertension: Secondary | ICD-10-CM

## 2011-01-27 DIAGNOSIS — Z111 Encounter for screening for respiratory tuberculosis: Secondary | ICD-10-CM

## 2011-01-27 DIAGNOSIS — Z0000 Encounter for general adult medical examination without abnormal findings: Principal | ICD-10-CM

## 2011-01-27 DIAGNOSIS — Z23 Encounter for immunization: Secondary | ICD-10-CM

## 2011-01-27 LAB — THYROID SCREEN TSH REFLEX FT4: THYROID SCREEN TSH REFLEX FT4: 1.52 u[IU]/mL (ref 0.34–5.60)

## 2011-01-27 MED ORDER — TRIAMCINOLONE ACETONIDE 0.1 % EX OINT
TOPICAL_OINTMENT | Freq: Two times a day (BID) | CUTANEOUS | Status: AC
Start: 2011-01-27 — End: 2011-07-27

## 2011-01-27 MED ORDER — FAMOTIDINE 20 MG PO TABS
20.00 mg | ORAL_TABLET | Freq: Two times a day (BID) | ORAL | Status: AC
Start: 2011-01-27 — End: 2011-07-27

## 2011-01-27 NOTE — Progress Notes (Signed)
Comes in for a PE with a list of notes:    Restarted zumba. Started having slight pain left SI area. Took irregular ibuprofen which does help.    Hair loss. Improved some with change of hair color agent, but still marked thinning.    Planning on return to [place] in about a year. Problems with house there. Anxiety causing heartburn. TUMS help briefly.    Derm referral requested for multiple skin lesions, none melanotic--done    Bunion both feet, may want to see podiatry.    Chief Complaint:  Linda Rodriguez is a 60 year old female who presents for a physical exam.   Patient also has other current concerns as detailed above.  I have reviewed the patient's medical history in detail and updated the computerized patient record.  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]         Date Noted: 04/29/2009      Vitamin D Deficiency [268.9G]         Date Noted: 11/20/2008                        VITAMIN D,25 HYDROXY (ng/ml)             Date                                       Date              Value                          01/18/2011              44.5                           04/03/2010              26.7*                          11/20/2008              28.2*                         ----------                  Atypical Chest Pain [786.59AC]         Date Noted: 11/01/2008            Admission 10/2008 MI ruled out ETT/mibi normal,            probably chest wall after             exercise      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 [477.8]         Date Noted: 11/18/2007      Generalized Osteoarthrosis, 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]         Date Noted: 08/25/2005            Replens      MITRAL VALVE DISORDER [424.0]         Date Noted: 08/10/2004            History of rheumatic fever            Echo 9/01 mild mitral regurgitation      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 for prevention of osteoporosis  famotidine (PEPCID) 20 MG tablet, Take 1 tablet by mouth 2 (two) times daily. Until improved, then nightly PRN.;  triamcinolone (KENALOG) 0.1 % ointment, Apply  topically 2 (two) times daily. When needed to itchy spots.  Allergies: Propoxyphene, Diclofenac Potassium    Immunization History  Administered            Date(s) Administered    Flu Vaccine > 3 Yrs   12/21/2010      Hepatitis B           03/27/2004  04/27/2004  10/26/2004      PPD                   07/01/2004  12/20/2007  01/27/2011      Td  03/23/1994  07/03/2004      Past Medical History    menopause 2001 08/10/2004    MITRAL VALVE  DISORDER 08/10/2004    Comment: History of rheumatic fever Echo 9/01 mild mitral regurgitation    Calculus of kidney 08/10/2004    HYPERLIPIDEMIA NEC/NOS 08/10/2004    Comment: LDL 194    Presbyopia 04/29/2009         Past Surgical History    NIPPLE/AREOLA RECONSTRUCTION 1995    Comment breast reduction         Social History   Marital Status: Single  Spouse Name: N/A    Years of Education: N/A  Number of Children: N/A     Occupational History  translator NONE      Social History Main Topics   Smoking status: Never Smoker     Smokeless tobacco:     Alcohol Use: Yes  0.0 oz/week     Comment: less than weekly    Drug Use: No    Sexually Active: Not Currently  Partner(s): Female     Other Topics Concern    Military Service No    Blood Transfusions No    Caffeine Concern No    Occupational Exposure No    Sleep Concern No    Stress Concern No    Weight Concern No    Special Diet No    Exercise Yes    Comment: zumba    Seat Belt Yes    Self-Exams Yes     Social History Narrative   None on file        Family History    Heart Father     Comment: MI age 61    Osteoporosis Mother     Asthma Sister     Asthma Brother     Diabetes Brother     Tree surgeon Abuse Brother     Cataracts Father     Hypertension Sister     Hypertension Brother     Diabetes Brother     Cancer - Breast FamHxNeg     Cancer - Colon Sister          Review of Systems:                   Skin: various rough areas  Eyes: negative  Ears/Nose/Throat: negative  Respiratory: negative  Cardiovascular: negative  Gastrointestinal: negative  Genitourinary: negative  Gyn:no breast pain or new or enlarging lumps on self exam and no vaginal bleeding  Musculoskeletal: as above  Neurologic: negative  Endocrine: negative  Psychiatric: sleep disturbance and anxiety  Hematologic/Lymphatic/Immunologic: negative    Physical:  BP 148/72  Pulse 75  Temp(Src) 98.3 F (36.8 C) (Temporal)  Ht 5' 1.25" (1.556 m)  Wt 115 lb (52.164 kg)  BMI 21.55 kg/m2  SpO2 99%  Body mass index is  21.55 kg/(m^2).  General appearance: healthy, alert, well developed, well nourished  Eyes: negative, PERLA, EOM's full  Skin: skin color, texture, turgor are normal  Head: Normocephalic. No masses, lesions, tenderness or abnormalities  Ears: External ears normal. Canals clear. TM's normal.  Nose/Sinuses: negative  Oropharynx: Lips, mucosa, and tongue normal. Teeth and gums normal. Oropharynx moist and without lesion  Neck: supple. No adenopathy. Thyroid symmetric, normal size, and without nodularity  Back:  symmetric, no curvature.   Lungs:  chest symmetric with normal A/P diameter, no chest deformities noted, normal respiratory rate and rhythm, lungs clear to auscultation  Heart: regular rate and rhythm, S1 normal,  S2 normal, no murmurs, clicks, or gallops  Breast: symmetric, no dominant or suspicious mass, no skin or nipple changes, no axillary adenopathy.   Breast self exam is taught and encouraged  Abdomen:  soft, non-tender. No masses, no organomegaly  Extremities:  normal. No deformities, edema, or skin discoloration  Musculoskeletal:  Muscular strength intact.  Neuro: mental status intact, cranial nerves 2-12 intact, muscle tone normal, normal gait   Pelvic:  Deferred    Rectal: deferred    Health Counseling:  Smoking:  Reviewed and Discussed  Substance Use Issues:   Reviewed and Discussed   Diet, Exercise, Wt. Control:  Reviewed and Discussed  Dental Health:  Reviewed and Discussed  Vision Health:  Reviewed and Discussed  Mental Health:  Reviewed and Discussed  Domestic Violence:  Reviewed and Discussed  Sexual Health:  Reviewed and Discussed  BSE:  Reviewed and Discussed  Osteoporosis: Reviewed and Discussed     Screening:  Pap  Up to date  Mammogram Reminder set for Jan  Colorectal cancer screening Reminder set for 2013      ASSESSMENT/PLAN:    V70.0 Routine general medical examination at a health care facility  (primary encounter diagnosis)    782.1R Rash  Comment: no lesions suspicious for malignancy,  some ? AK  Plan: REFERRAL TO DERMATOLOGY (EXT)        V05.4N Need for shingles vaccine    V74.1 Screening examination for pulmonary tuberculosis  Comment: occupational exposure  Plan: SKIN TEST TUBERCULOSIS INTRADERMAL          796.2B Elevated blood pressure (not hypertension)  Comment: this is an anomaly for her but there is a FH--she will monitor, discussed normal range  Plan: MYCHART BP FLOWSHEET          692.9BU Eczema  Comment: mild  Plan: TAO    704.00K Hair loss  Comment: scalp exam unremarkable  Plan: THYROID SCREEN TSH

## 2011-01-27 NOTE — Progress Notes (Signed)
S:  Linda Rodriguez is here for a PPD plant.  1)  Has your arm ever turned red, bumpy, swollen or sore after receiving a PPD test for TB? No  2)  Have you ever had the disease Tuberculosis? No  3)  Have you traveled outside the country over the past 2 years? No  4)  Are you taking steroid medication? No  5)  Has there been any cancer or illnesses resulting in a decreased immune system recently? No  6)  Have you ever received BCG? No  6)  ROS of TB symptoms: None      O:  PPD planted.  0.1cc Mantox ID, left forearm.    A:  PPD plant, screen TB.    P:  Pt advised to return in 48-72 hours for appointment to interpret PPD results.       Scheduled in a nursing visit for follow up and documentation of completion.

## 2011-01-31 ENCOUNTER — Encounter (HOSPITAL_BASED_OUTPATIENT_CLINIC_OR_DEPARTMENT_OTHER): Payer: Self-pay | Admitting: Family Medicine

## 2011-02-01 NOTE — Telephone Encounter (Signed)
Left voicemail to call back and gave my office number

## 2011-02-04 ENCOUNTER — Telehealth (HOSPITAL_BASED_OUTPATIENT_CLINIC_OR_DEPARTMENT_OTHER): Payer: Self-pay

## 2011-02-04 ENCOUNTER — Encounter (HOSPITAL_BASED_OUTPATIENT_CLINIC_OR_DEPARTMENT_OTHER): Payer: Self-pay

## 2011-02-04 NOTE — Telephone Encounter (Signed)
Latriece called with questions about Pepcid that was ordered. The pharmacy didn't receive the Rx so she bought oTC and can't remember what it was. Will call back later with info  Discussed dietary changes to help with heartburn.

## 2011-02-05 ENCOUNTER — Encounter (HOSPITAL_BASED_OUTPATIENT_CLINIC_OR_DEPARTMENT_OTHER): Payer: Self-pay | Admitting: Family Medicine

## 2011-02-08 NOTE — Telephone Encounter (Addendum)
Addended by: Shelda Jakes on: 02/08/2011      Modules accepted: Disposition Section

## 2011-02-10 ENCOUNTER — Ambulatory Visit: Payer: HMO

## 2011-02-10 DIAGNOSIS — Z23 Encounter for immunization: Principal | ICD-10-CM

## 2011-02-10 NOTE — Progress Notes (Signed)
See scanned VAR.  Checked on patient after 10 minutes because pt needed to go back to work at [employer], no adverse reactions.

## 2011-04-16 ENCOUNTER — Encounter (HOSPITAL_BASED_OUTPATIENT_CLINIC_OR_DEPARTMENT_OTHER): Payer: Self-pay

## 2011-05-02 ENCOUNTER — Encounter (HOSPITAL_BASED_OUTPATIENT_CLINIC_OR_DEPARTMENT_OTHER): Payer: Self-pay

## 2011-05-10 ENCOUNTER — Encounter (HOSPITAL_BASED_OUTPATIENT_CLINIC_OR_DEPARTMENT_OTHER): Payer: Self-pay | Admitting: Family Medicine

## 2011-05-14 ENCOUNTER — Ambulatory Visit: Payer: Self-pay | Admitting: Family Medicine

## 2011-05-17 ENCOUNTER — Encounter (HOSPITAL_BASED_OUTPATIENT_CLINIC_OR_DEPARTMENT_OTHER): Payer: Self-pay

## 2011-05-17 DIAGNOSIS — R030 Elevated blood-pressure reading, without diagnosis of hypertension: Secondary | ICD-10-CM | POA: Insufficient documentation

## 2011-05-17 HISTORY — DX: Elevated blood-pressure reading, without diagnosis of hypertension: R03.0

## 2011-05-18 LAB — MA SCREENING MAMMO BILATERAL WITH CAD

## 2011-06-25 ENCOUNTER — Telehealth (HOSPITAL_BASED_OUTPATIENT_CLINIC_OR_DEPARTMENT_OTHER): Payer: Self-pay | Admitting: Surgery

## 2011-06-25 NOTE — Telephone Encounter (Addendum)
Patient called back to inform Dr. Veto Kemps she has not scheduled colonoscopy because she still has not spoken to the person who is going to give her a ride to the procedure.  Will call back once she speaks to that person.

## 2011-06-25 NOTE — Telephone Encounter (Signed)
Left voice message for Linda Rodriguez to coordinate an upcoming procedure colonoscopy time/ date.

## 2011-07-02 ENCOUNTER — Ambulatory Visit (HOSPITAL_BASED_OUTPATIENT_CLINIC_OR_DEPARTMENT_OTHER): Payer: HMO | Admitting: Otolaryngology

## 2011-07-05 ENCOUNTER — Encounter (HOSPITAL_BASED_OUTPATIENT_CLINIC_OR_DEPARTMENT_OTHER): Payer: Self-pay | Admitting: Family Medicine

## 2011-07-05 DIAGNOSIS — Z1211 Encounter for screening for malignant neoplasm of colon: Principal | ICD-10-CM

## 2011-07-21 ENCOUNTER — Telehealth (HOSPITAL_BASED_OUTPATIENT_CLINIC_OR_DEPARTMENT_OTHER): Payer: Self-pay | Admitting: Surgery

## 2011-07-21 ENCOUNTER — Encounter (HOSPITAL_BASED_OUTPATIENT_CLINIC_OR_DEPARTMENT_OTHER): Payer: Self-pay | Admitting: Surgery

## 2011-07-21 NOTE — Telephone Encounter (Signed)
I have spoken with Ms. Derian regarding open access colonoscopy. We have scheduled this for Monday Aug 09, 2011 for an arrival time of 10:00 am at the [facility]. Ms. Attebery will come to office to pick up her letter and colonoscopy instructions. Golytely has been called into [facility] and spoke to Bairoa La Veinticinco at the pharmacy. Patient has been informed.

## 2011-07-22 ENCOUNTER — Encounter (HOSPITAL_BASED_OUTPATIENT_CLINIC_OR_DEPARTMENT_OTHER): Payer: Self-pay | Admitting: Surgery

## 2011-07-22 ENCOUNTER — Telehealth (HOSPITAL_BASED_OUTPATIENT_CLINIC_OR_DEPARTMENT_OTHER): Payer: Self-pay | Admitting: Surgery

## 2011-07-22 NOTE — Telephone Encounter (Signed)
Linda Rodriguez has called to rescheduled her colonoscopy from Aug 18, 2011 at 9 am. At patient request an appointment has been scheduled to meet with Dr. Veto Kemps regarding her family history and concern.

## 2011-08-06 ENCOUNTER — Ambulatory Visit (HOSPITAL_BASED_OUTPATIENT_CLINIC_OR_DEPARTMENT_OTHER): Payer: HMO | Admitting: Surgery

## 2011-08-06 ENCOUNTER — Encounter (HOSPITAL_BASED_OUTPATIENT_CLINIC_OR_DEPARTMENT_OTHER): Payer: Self-pay | Admitting: Surgery

## 2011-08-06 VITALS — BP 124/68 | HR 81 | Temp 97.8°F | Resp 20

## 2011-08-06 DIAGNOSIS — Z1211 Encounter for screening for malignant neoplasm of colon: Secondary | ICD-10-CM

## 2011-08-06 DIAGNOSIS — Z800 Family history of malignant neoplasm of digestive organs: Principal | ICD-10-CM

## 2011-08-06 NOTE — Progress Notes (Signed)
This patient has been referred for colonoscopy screening. There is no history of blood per rectum except very occasionally with a hard bowel movement, no diarrhea, changes in bowel habits or unintended weight loss. There is no personal history of colon cancer or IBD. She had a colonoscopy 10 years ago which was normal. She has a sister who died from colon cancer at age 62. The sister had never had a colonoscopy and had no symptoms until she had a positive fecal occult blood test. She has 8 siblings and now everyone has gotten their colonoscopies.    She normally has hard stools if she doesn't eat lots of fiber. We discussed in detail the diet restrictions beforehand.    The physical exam is generally normal. The patient appears well, alert and oriented x 3, pleasant and cooperative. Vitals as noted by nurse. No jaundice or skin rashes. Neck supple and free of adenopathy, or masses.  No thyromegaly.  Chest is clear to IPPA. Heart sounds are normal, no murmurs, clicks, gallops or rubs. Abdomen is soft, no distension, tenderness, masses or organomegaly. Extremities without edema and with normal strength and ROM.    The patient should undergo a screening colonoscopy. We discussed the reasons for colonoscopy screening and it's role in reducing the incidence of colorectal cancer. We discussed the details of the procedure as well as the risks which include, but are not limited to, bleeding, perforation, and a missed lesion. Informed consent was obtained and written instructions were reviewed and given.   Golytely prep was prescribed.   We will use NCS sedation.   We will schedule the colonoscopy at their convenience.    Stop ASA 5 days before.  Recommend colonoscopy every 5 years because of family history, even if this colonoscopy is fine.

## 2011-08-06 NOTE — Nursing Note (Signed)
>>   Cherrie Gauze, RN     Caleen Essex Aug 06, 2011  9:16 AM  Pt is here to discuss Colonscopy

## 2011-08-11 ENCOUNTER — Telehealth (HOSPITAL_BASED_OUTPATIENT_CLINIC_OR_DEPARTMENT_OTHER): Payer: Self-pay | Admitting: Surgery

## 2011-08-11 NOTE — Progress Notes (Signed)
Confirmed with Ms. Smoley her upcoming open access colonoscopy scheduled for Aug 18, 2011 for an arrival time of 8:30 am. No questions asked.

## 2011-08-17 ENCOUNTER — Telehealth (HOSPITAL_BASED_OUTPATIENT_CLINIC_OR_DEPARTMENT_OTHER): Payer: Self-pay | Admitting: Registered Nurse

## 2011-08-17 NOTE — Progress Notes (Signed)
Erroneous encounter

## 2011-08-17 NOTE — Telephone Encounter (Signed)
Message copied by Cherrie Gauze on Tue Aug 17, 2011 12:25 PM  ------       Message from: Candelaria Stagers       Created: Tue Aug 17, 2011  8:45 AM         Linda Rodriguez has called regarding her upcoming procedure colonoscopy scheduled for Aug 18, 2011. She has laryngitis with a cough. No Fever. She would like to know if it is ok for her to take honey or lemon with her tea today.  ------

## 2011-08-18 ENCOUNTER — Ambulatory Visit (HOSPITAL_BASED_OUTPATIENT_CLINIC_OR_DEPARTMENT_OTHER)
Admit: 2011-08-18 | Disposition: A | Discharge status: Home / Self Care | Payer: Self-pay | Source: Ambulatory Visit | Attending: Surgery | Admitting: Surgery

## 2011-08-18 ENCOUNTER — Encounter (HOSPITAL_BASED_OUTPATIENT_CLINIC_OR_DEPARTMENT_OTHER): Payer: Self-pay | Admitting: Surgery

## 2011-08-18 LAB — GI OPERATIVE NOTE

## 2011-08-18 MED ORDER — DIPHENHYDRAMINE HCL 50 MG/ML IJ SOLN
25.0000 mg | Freq: Once | INTRAMUSCULAR | Status: DC | PRN
Start: 2011-08-18 — End: 2011-08-18

## 2011-08-18 MED ORDER — MIDAZOLAM HCL 2 MG/2ML IJ SOLN
INTRAMUSCULAR | Status: AC
Start: 2011-08-18 — End: 2011-08-18
  Filled 2011-08-18: qty 2

## 2011-08-18 MED ORDER — FENTANYL CITRATE 0.05 MG/ML IJ SOLN
INTRAMUSCULAR | Status: AC
Start: 2011-08-18 — End: 2011-08-18
  Filled 2011-08-18: qty 2

## 2011-08-18 MED ORDER — LACTATED RINGERS IV SOLN
INTRAVENOUS | Status: DC
Start: 2011-08-18 — End: 2011-08-18

## 2011-08-18 MED ORDER — WATER FOR IRRIGATION, STERILE IR SOLN
Freq: Once | Status: AC
Start: 2011-08-18 — End: 2011-08-18
  Administered 2011-08-18: 10:00:00
  Filled 2011-08-18: qty 1.2

## 2011-08-18 MED ORDER — MIDAZOLAM HCL 2 MG/2ML IJ SOLN
0.5000 mg | INTRAMUSCULAR | Status: DC
Start: 2011-08-18 — End: 2011-08-18
  Administered 2011-08-18: 4 mg via INTRAVENOUS

## 2011-08-18 MED ORDER — MIDAZOLAM HCL 2 MG/2ML IJ SOLN
INTRAMUSCULAR | Status: AC
Start: 2011-08-18 — End: 2011-08-18
  Filled 2011-08-18: qty 4

## 2011-08-18 MED ORDER — SURGILUBE EX GEL
CUTANEOUS | Status: AC
Start: 2011-08-18 — End: 2011-08-18
  Filled 2011-08-18: qty 60

## 2011-08-18 MED ORDER — FENTANYL CITRATE 0.05 MG/ML IJ SOLN
25.0000 ug | INTRAMUSCULAR | Status: DC
Start: 2011-08-18 — End: 2011-08-18
  Administered 2011-08-18: 100 ug via INTRAVENOUS

## 2011-08-18 NOTE — H&P (Signed)
Patient Assessment Update: (Fill out Prior to procedure or within 24 hours of  admission if H&P done pre-admission.)   Re-evaluation including history review and physical examination has been performed.    Patient's Condition No Change    Jonny Ruiz, MD, 08/18/2011, 8:56 AM

## 2011-08-19 LAB — SURGICAL PATH SPECIMEN

## 2011-08-20 ENCOUNTER — Telehealth (HOSPITAL_BASED_OUTPATIENT_CLINIC_OR_DEPARTMENT_OTHER): Payer: Self-pay | Admitting: Family Medicine

## 2011-08-20 ENCOUNTER — Ambulatory Visit (HOSPITAL_BASED_OUTPATIENT_CLINIC_OR_DEPARTMENT_OTHER): Payer: HMO | Admitting: Family Medicine

## 2011-08-20 VITALS — BP 120/60 | HR 97 | Temp 97.6°F | Wt 111.0 lb

## 2011-08-20 DIAGNOSIS — J3089 Other allergic rhinitis: Secondary | ICD-10-CM

## 2011-08-20 DIAGNOSIS — J209 Acute bronchitis, unspecified: Principal | ICD-10-CM

## 2011-08-20 DIAGNOSIS — H6500 Acute serous otitis media, unspecified ear: Secondary | ICD-10-CM

## 2011-08-20 MED ORDER — GUAIFENESIN-CODEINE 100-10 MG/5ML PO SOLN
5.0000 mL | Freq: Every evening | ORAL | Status: DC
Start: 2011-08-20 — End: 2011-08-20

## 2011-08-20 MED ORDER — BENZONATATE 100 MG PO CAPS
100.00 mg | ORAL_CAPSULE | Freq: Three times a day (TID) | ORAL | Status: AC | PRN
Start: 2011-08-20 — End: 2011-08-30

## 2011-08-20 MED ORDER — FLUTICASONE PROPIONATE 50 MCG/ACT NA SUSP
1.0000 | Freq: Every day | NASAL | Status: AC
Start: 2011-08-20 — End: 2012-07-26

## 2011-08-20 MED ORDER — PSEUDOEPHEDRINE HCL ER 120 MG PO TB12
120.00 mg | ORAL_TABLET | Freq: Two times a day (BID) | ORAL | Status: AC
Start: 2011-08-20 — End: 2011-09-19

## 2011-08-20 MED ORDER — AZITHROMYCIN 250 MG PO TABS
250.00 mg | ORAL_TABLET | Freq: Every day | ORAL | Status: AC
Start: 2011-08-20 — End: 2011-08-25

## 2011-08-20 NOTE — Patient Instructions (Signed)
Acute Bronchitis You have acute bronchitis. This means you have a chest cold. The airways in your lungs are red and sore (inflamed). Acute means it is sudden onset.  CAUSES Bronchitis is most often caused by the same virus that causes a cold. SYMPTOMS   Body aches.   Chest congestion.   Chills.   Cough.   Fever.   Shortness of breath.   Sore throat.  TREATMENT  Acute bronchitis is usually treated with rest, fluids, and medicines for relief of fever or cough. Most symptoms should go away after a few days or a week. Increased fluids may help thin your secretions and will prevent dehydration. Your caregiver may give you an inhaler to improve your symptoms. The inhaler reduces shortness of breath and helps control cough. You can take over-the-counter pain relievers or cough medicine to decrease coughing, pain, or fever. A cool-air vaporizer may help thin bronchial secretions and make it easier to clear your chest. Antibiotics are usually not needed but can be prescribed if you smoke, are seriously ill, have chronic lung problems, are elderly, or you are at higher risk for developing complications.Allergies and asthma can make bronchitis worse. Repeated episodes of bronchitis may cause longstanding lung problems. Avoid smoking and secondhand smoke.Exposure to cigarette smoke or irritating chemicals will make bronchitis worse. If you are a cigarette smoker, consider using nicotine gum or skin patches to help control withdrawal symptoms. Quitting smoking will help your lungs heal faster. Recovery from bronchitis is often slow, but you should start feeling better after 2 to 3 days. Cough from bronchitis frequently lasts for 3 to 4 weeks. To prevent another bout of acute bronchitis:  Quit smoking.   Wash your hands frequently to get rid of viruses or use a hand sanitizer.   Avoid other people with cold or virus symptoms.   Try not to touch your hands to your mouth, nose, or eyes.  SEEK  IMMEDIATE MEDICAL CARE IF:  You develop increased fever, chills, or chest pain.   You have severe shortness of breath or bloody sputum.   You develop dehydration, fainting, repeated vomiting, or a severe headache.   You have no improvement after 1 week of treatment or you get worse.  MAKE SURE YOU:   Understand these instructions.   Will watch your condition.   Will get help right away if you are not doing well or get worse.  Document Released: 04/15/2004 Document Revised: 11/18/2010 Document Reviewed: 07/01/2010 Exit[employer] Patient Information 2012 [employer]. 

## 2011-08-20 NOTE — Progress Notes (Signed)
SUBJECTIVE  Linda Rodriguez is a 61 yo woman who reports worsening productive cough of yellow/green sputum;  Reports chills no fever; ear fullness; pt reports sore throat no respiratory distress    Pt would also like refill of flonase    OBJECTIVE  BP 120/60  Pulse 97  Temp(Src) 97.6 F (36.4 C)  Wt 111 lb (50.349 kg)  BMI 20.98 kg/m2  O: NAD; difficult to speak due to cough  HEENT: atraumatic, no icterus, OP clear; neck supple no LAN; L TM with serous fluid;  Skin: no lesions or rash  Lungs: CTA b/l  Cardiac: nl S1/S2, no murmurs  Ext: no edema or clubbing    A/P  (466.0) Acute bronchitis  (primary encounter diagnosis)  Comment: increased sputum and cough, ? Bronchitis; lungs clear;   Plan: trial z-pack + tessalon perles for cough    (381.01) Acute serous otitis media  Comment: L ear with serous fluid; most likely cause of ear fullness;   Plan: trial flonase; may add decongestant as well    We discussed the patients current medications. The patient expressed understanding and no barriers to adherence were identified.    (477.8) Allergic rhinitis due to other allergen  Comment: refilled  Plan: fluticasone (FLONASE) 50 MCG/ACT nasal spray

## 2011-08-20 NOTE — Progress Notes (Signed)
I have attempted without success to contact this patient by phone to let her know that Juliane change her script from Cheratussin to [employer] and her script was sent to the pharmacy.

## 2011-08-20 NOTE — Telephone Encounter (Signed)
Message copied by Gaynelle Arabian on Fri Aug 20, 2011  5:29 PM  ------       Message from: Madilyn Fireman       Created: Fri Aug 20, 2011  4:11 PM       Regarding: RX question                       Linda Rodriguez [id number], 61 year old, female, Telephone Information:       Home Phone      [phone]       Work Phone      [phone]       Mobile          [phone]                     Patient's Preferred Pharmacy:               [facility] - [address] [place], [place] - [address]       Phone: [phone] Fax: [phone]                     CONFIRMED TODAY: [name] NUMBER:  [phone]       Best time to call back:        Cell phone:        Other phone:              Available times:              Patient's language of care: English              Patient does not need an interpreter.              Patient's PCP: Tonette Lederer MD              Person calling on behalf of patient: Patient (self)              Calls today with questions and concerns.        patient calling because she would like the medication guaifenesin-codeine (CHERATUSSIN AC) 100-10 MG/5ML liquid to be sent to the pharmacy as she can take at night as she is not used to the new RX that [name] had sent. Also the patient would like to know what she could do about her eye discharge.                       ------

## 2011-08-23 ENCOUNTER — Encounter (HOSPITAL_BASED_OUTPATIENT_CLINIC_OR_DEPARTMENT_OTHER): Payer: Self-pay | Admitting: Family Medicine

## 2011-08-23 NOTE — Telephone Encounter (Signed)
Message copied by Gaynelle Arabian on Mon Aug 23, 2011  9:36 AM  ------       Message from: Marnee Guarneri       Created: Mon Aug 23, 2011  9:31 AM       Regarding: FW: RX question         It looks like this was already taken care of, but I figured i would forward it to you just in case!              Happy Monday,       [name]       ----- Message -----          From: Madilyn Fireman          Sent: 08/20/2011   4:11 PM            To: Wynelle Link Rn'S       Subject: RX question                                                              Linda Rodriguez [id number], 61 year old, female, Telephone Information:       Home Phone      [phone]       Work Phone      [phone]       Mobile          [phone]                     Patient's Preferred Pharmacy:               [facility] - [address] [place], [place] - [address]       Phone: [phone] Fax: [phone]                     CONFIRMED TODAY: [name] NUMBER:  [phone]       Best time to call back:        Cell phone:        Other phone:              Available times:              Patient's language of care: English              Patient does not need an interpreter.              Patient's PCP: Tonette Lederer MD              Person calling on behalf of patient: Patient (self)              Calls today with questions and concerns.        patient calling because she would like the medication guaifenesin-codeine (CHERATUSSIN AC) 100-10 MG/5ML liquid to be sent to the pharmacy as she can take at night as she is not used to the new RX that [facility] had sent. Also the patient would like to know what she could do about her eye discharge.                              ------

## 2011-08-23 NOTE — Progress Notes (Signed)
Returned call to pt  She is taking the tessalon perles as ordered but it doesn't help at night and she is up all night coughing. Would like the chertussin for night time.  Advised we will get back to her

## 2011-08-23 NOTE — Progress Notes (Signed)
Left v/m to explain tessalon perles were sent because they are less sedating and thus safer given patient's age; however patient requesting cheratussin; explained she may take this med with caution; explained tamperproof rx I gave to her Friday is needed to pick up script;    In regards to eye discharge explained as long as discharge is not pus-like and perfuse she may use warm damp cloth and antihistamine to help symptoms.    Pt asked to call clinic to confirm she received message and to discuss any further questions.    Tristram Milian PA-C

## 2011-08-24 ENCOUNTER — Telehealth (HOSPITAL_BASED_OUTPATIENT_CLINIC_OR_DEPARTMENT_OTHER): Payer: Self-pay | Admitting: Family Medicine

## 2011-08-24 NOTE — Progress Notes (Signed)
Pt reports around 9-10 PM experiences persistent coughing;  Reports during the day not coughing much;  Tried vicks which was very helpful;   Also trialed nyquil which helped her sleep but drowsy during   Denies SOB;  Sore harsh throat much improved;  Finished abx;  Symptoms improving overall;    As tessalon perles not working at night she may D/c.  Continue vicks as this is helpful; push fluids;  Pt not taking cheratussin;  Call if symptoms worsen or fail to resolves.  Tyvon Eggenberger PA-C

## 2011-09-17 ENCOUNTER — Encounter (HOSPITAL_BASED_OUTPATIENT_CLINIC_OR_DEPARTMENT_OTHER): Payer: Self-pay | Admitting: Family Medicine

## 2011-12-07 ENCOUNTER — Ambulatory Visit (HOSPITAL_BASED_OUTPATIENT_CLINIC_OR_DEPARTMENT_OTHER): Payer: Self-pay | Admitting: Internal Medicine

## 2012-04-05 ENCOUNTER — Ambulatory Visit (HOSPITAL_BASED_OUTPATIENT_CLINIC_OR_DEPARTMENT_OTHER): Payer: HMO | Admitting: Family Medicine

## 2012-04-05 ENCOUNTER — Encounter (HOSPITAL_BASED_OUTPATIENT_CLINIC_OR_DEPARTMENT_OTHER): Payer: Self-pay | Admitting: Family Medicine

## 2012-04-05 VITALS — BP 120/68 | HR 91 | Temp 97.4°F | Ht 61.0 in | Wt 112.0 lb

## 2012-04-05 DIAGNOSIS — Z0000 Encounter for general adult medical examination without abnormal findings: Principal | ICD-10-CM

## 2012-04-05 DIAGNOSIS — Z833 Family history of diabetes mellitus: Secondary | ICD-10-CM

## 2012-04-05 DIAGNOSIS — L989 Disorder of the skin and subcutaneous tissue, unspecified: Secondary | ICD-10-CM

## 2012-04-05 DIAGNOSIS — Z23 Encounter for immunization: Secondary | ICD-10-CM

## 2012-04-05 DIAGNOSIS — E785 Hyperlipidemia, unspecified: Secondary | ICD-10-CM

## 2012-04-05 LAB — LOW DENSITY LIPOPROTEIN DIRECT: LOW DENSITY LIPOPROTEIN DIRECT: 143 mg/dl — ABNORMAL HIGH (ref 0–100)

## 2012-04-05 NOTE — Progress Notes (Signed)
VIS given prior to administration and reviewed with the patient and or legal guardian. Patient understands the disease and the vaccine. See immunization/Injection module or chart review for date of publication and additional information.

## 2012-04-06 ENCOUNTER — Encounter (HOSPITAL_BASED_OUTPATIENT_CLINIC_OR_DEPARTMENT_OTHER): Payer: Self-pay | Admitting: Family Medicine

## 2012-04-06 NOTE — Progress Notes (Signed)
Chief Complaint:  Linda Rodriguez is a 62 year old female who presents for a physical exam.   Patient also has other current concerns as detailed below.  I have reviewed the patient's medical history in detail and updated the computerized patient record.  Patient Active Problem List    Special screening for malignant neoplasms, colon         Date Noted: 08/06/2011      Family history of malignant neoplasm of gastrointestinal tract         Date Noted: 08/06/2011      Elevated hemoglobin A1c         Date Noted: 01/19/2011                        HEMOGLOBIN A1C (%)             Date  Value              01/18/2011  5.9*             ----------                  Hyperopia         Date Noted: 04/29/2009      Blepharitis         Date Noted: 04/29/2009      Vitamin d deficiency         Date Noted: 11/20/2008                        VITAMIN D,25 HYDROXY (ng/ml)             Date               Date  Value              01/18/2011  44.5               04/03/2010  26.7*              11/20/2008  28.2*             ----------                  Atypical Chest Pain         Date Noted: 11/01/2008            Admission 10/2008 MI ruled out ETT/mibi normal,            probably chest wall after             exercise      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         Date Noted: 11/18/2007      Generalized Osteoarthrosis, Involving Hand         Date Noted: 06/17/2007      Family history of diabetes mellitus         Date Noted: 08/25/2005                                          Postmenopausal Atrophic Vaginitis         Date Noted: 08/25/2005            Replens      MITRAL VALVE DISORDER         Date Noted: 08/10/2004  History of rheumatic fever            Echo 9/01 mild mitral regurgitation      CALCULUS OF KIDNEY         Date Noted: 08/10/2004      HYPERLIPIDEMIA NEC/NOS         Date Noted: 08/10/2004            LDL 194                  Medications at the end of today's visit are:  Biotin 300 MCG TABS, Take 300 mg by mouth  daily.;  Calcium Carbonate-Vitamin D (CALCIUM 600-D) 600-400 MG-UNIT TABS Tablet, Take 1 tablet by mouth daily. for prevention of osteoporosis;  Cholecalciferol (VITAMIN D) 2000 UNIT CAPS, Take 1 capsule by mouth daily.;  PX ENTERIC ASPIRIN 81 MG OR TBEC, 1 tablet by mouth daily;  ATORVASTATIN CALCIUM 10 MG OR TABS, one every other day  OTHER MEDICATION, ciprofibrate 100 mg from [place] (a fibric acid)  one every other day;  fluticasone (FLONASE) 50 MCG/ACT nasal spray, 1 spray by Each Nostril route daily.  Allergies: Diclofenac Potassium; Propoxyphene  Immunization History   Administered Date(s) Administered   . Flu Vaccine > 3 Yrs 12/21/2010, 12/21/2011   . Hepatitis B 03/27/2004, 04/27/2004, 10/26/2004   . PPD 07/01/2004, 12/20/2007, 01/27/2011   . Td 03/23/1994, 07/03/2004   . Tdap 04/05/2012   . 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 Baltazar Apo "ECC inadequate but not suspicious clinically for abnormality; follow one year pap as planned." 3/09 Pap insufficient cells, HPV negative 9/09 Pap normal HPV positive repeat colpo after topical estrogens to facilitate ECC normal Pap 11/2008 WNL HPV negative, return to routine screening 03/2010@ patient request HPV negative, Pap no transformation zone, no rep    Elevated blood pressure reading without diagnosis of hypertension 05/17/2011    Comment: Most Recent BP Reading(s) 04/05/12 : 120/68 08/20/11 : 120/60 08/18/11 : 103/57    Patient reports February BP readings 110/54, 114/70 and states that since her life has become less stressful, her BP decreased.   Most Recent BP Reading(s) 01/27/11 : 148/72  04/08/10 : 118/60 05/02/09 : 112/64 11/20/08 : 110/68 10/30/08 : 112/60           Past Surgical History    NIPPLE/AREOLA RECONSTRUCTION  1995    Comment breast reduction    BREAST REDUCTION SURGERY         Social History   Marital Status: Single  Spouse Name: N/A    Years of Education: N/A  Number of Children: N/A     Occupational History  translator NONE      Social History Main Topics   Smoking status: Never Smoker     Smokeless tobacco:     Alcohol Use: Yes  0.0 oz/week     Comment: less than weekly    Drug Use: No    Sexually Active: Not Currently  Partner(s): Female     Other Topics Theatre stage manager Service  No    Blood Transfusions No    Caffeine Concern No    Occupational Exposure No    Sleep Concern No    Stress Concern No    Weight Concern No    Special Diet No    Exercise Yes    Comment: stopped zumba due to hip pain, plans to restart    Seat Belt Yes    Self-Exams Yes     Social History Narrative    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 [employer]        Family History    Heart Father     Comment: MI age 60    Osteoporosis Mother     Asthma Sister     Asthma Brother     Diabetes Brother     Tree surgeon Abuse Brother     Cataracts Father     Hypertension Sister     Hypertension Brother     Diabetes Brother     Cancer - Breast FamHxNeg     Cancer - Colon Sister        Review of Systems:                   Skin: a few areas of persistent flakiness  Eyes: negative  Ears/Nose/Throat: negative  Respiratory: negative  Cardiovascular: negative  Gastrointestinal: negative  Genitourinary: negative  Gyn:no breast pain or new or enlarging lumps on self exam and no vaginal bleeding  Musculoskeletal: negative  Neurologic: negative  Endocrine: negative  Psychiatric: negative  Hematologic/Lymphatic/Immunologic: negative    Physical:  BP 120/68  Pulse 91  Temp(Src) 97.4 F (36.3 C) (Oral)  Ht 5\' 1"  (1.549 m)  Wt 112 lb (50.803 kg)  BMI 21.17 kg/m2  SpO2 98%  Body mass index is 21.17 kg/(m^2).  General appearance:  healthy, alert, well developed, well nourished  Eyes: negative, PERLA, EOM's full  Skin: skin color, texture, turgor are normal  Head: Normocephalic. No masses, lesions, tenderness or abnormalities  Ears: External ears normal. Canals clear. TM's normal.  Nose/Sinuses: negative  Oropharynx: Lips, mucosa, and tongue normal. Teeth and gums normal. Oropharynx moist and without lesion  Neck: supple. No adenopathy. Thyroid symmetric, normal size, and without nodularity  Back:  symmetric, no curvature.   Lungs:  chest symmetric with normal A/P diameter, no chest deformities noted, normal respiratory rate and rhythm, lungs clear to auscultation  Heart: regular rate and rhythm, S1 normal, S2 normal, no murmurs, clicks, or gallops  Breast: symmetric, no dominant or suspicious mass, no skin or nipple changes, no axillary adenopathy.   Breast self exam is taught and encouraged  Abdomen:  soft, non-tender. No masses, no organomegaly  Extremities:  normal. No deformities, edema, or skin discoloration  Musculoskeletal:  Muscular strength intact.  Neuro: mental status intact, cranial nerves 2-12 intact, muscle tone normal, normal gait   Pelvic:  deferred  Rectal: deferred    Health Counseling:  Smoking:  Reviewed and Discussed  Substance Use Issues:   Reviewed and Discussed   Diet, Exercise, Wt. Control:  Reviewed and Discussed  Dental Health:  Reviewed and Discussed  Vision Health:  Reviewed and Discussed  Mental Health:  Reviewed and Discussed  Domestic Violence:  Reviewed and Discussed  Sexual Health:  Reviewed and Discussed  BSE:  Reviewed and Discussed  Osteoporosis: Reviewed and Discussed     Screening:  Pap  Up to date  Mammogram she will schedule  Colorectal cancer screening up to date  ASSESSMENT/PLAN:    (V70.0) Routine general medical examination at a health care facility  (primary encounter diagnosis)      (V06.1) Vaccin for DTP    (272.4) HYPERLIPIDEMIA NEC/NOS  Comment: Continues to take a combination med she  obtains in [place] that contains atorvastatin and a fibrate, which she tolerates well taking every other day  Plan: LOW DENSITY LIPOPROTEIN,DIRECT, COLLECTION         VENOUS BLOOD VENIPUNCTURE         (V18.0) Family history of diabetes mellitus  Comment:   HEMOGLOBIN A1C (%)   Date  Value    01/18/2011  5.9*   ----------    Plan: HEMOGLOBIN A1C          (709.9) Skin lesion  Comment: She has very fair skin with extensive freckles and a couple of lesion that have not resolved over time, r/o AK  Plan: REFERRAL TO DERMATOLOGY (EXT)

## 2012-04-07 LAB — HEMOGLOBIN A1C
ESTIMATED AVERAGE GLUCOSE: 123 (ref 74–160)
HEMOGLOBIN A1C: 5.9 % — ABNORMAL HIGH (ref 4.0–5.6)

## 2012-05-16 ENCOUNTER — Ambulatory Visit: Payer: Self-pay | Admitting: Family Medicine

## 2012-05-17 LAB — MA SCREENING MAMMO BILATERAL WITH CAD

## 2012-06-14 ENCOUNTER — Encounter (HOSPITAL_BASED_OUTPATIENT_CLINIC_OR_DEPARTMENT_OTHER): Payer: Self-pay | Admitting: Family Medicine

## 2012-06-16 ENCOUNTER — Encounter (HOSPITAL_BASED_OUTPATIENT_CLINIC_OR_DEPARTMENT_OTHER): Payer: Self-pay | Admitting: Family Medicine

## 2012-07-03 ENCOUNTER — Telehealth (HOSPITAL_BASED_OUTPATIENT_CLINIC_OR_DEPARTMENT_OTHER): Payer: Self-pay | Admitting: Registered Nurse

## 2012-07-03 ENCOUNTER — Encounter (HOSPITAL_BASED_OUTPATIENT_CLINIC_OR_DEPARTMENT_OTHER): Payer: Self-pay | Admitting: Registered Nurse

## 2012-07-03 NOTE — Progress Notes (Signed)
Linda Rodriguez is a 62 year old female who calls today complaining of back pain that has been ongoing since Saturday. Patient was carrying a heavy suitcase and has noticed the pain since then.  Discussed the importance of tylenol and heat. Patient requesting to be seen. Before appointment could be booked, call was lost. Will e-mail patient for follow up.

## 2012-07-11 ENCOUNTER — Other Ambulatory Visit (HOSPITAL_BASED_OUTPATIENT_CLINIC_OR_DEPARTMENT_OTHER): Payer: Self-pay | Admitting: Family Medicine

## 2012-07-11 DIAGNOSIS — C44311 Basal cell carcinoma of skin of nose: Principal | ICD-10-CM

## 2012-07-11 NOTE — Progress Notes (Signed)
Appointment scheduled for: Plastic Surgery    Specialty Location: [facility]    Specialist's name: Newman Pies, MD        Specialist's NPI#: [id number]                         Specialty Phone Number: [phone]     Specialty Fax Number: [phone]    Reason for appointment: basal cell on nose    Day of appt: Tuesday                                    Date of appt: 07/11/2012  Time of appt:    Patient Notification: Referred by Dermatologist    If you have any questions concerning the referral authorization please call [phone].    If you have any questions concerning your appointment please call the specialty phone number above.

## 2012-07-13 NOTE — Progress Notes (Signed)
Lashina, I signed the referral so we can get this going, but--  We are trying to preferentially refer to [place] and [place] as part of our Writer. May I suggest a Moh's surgeon in one of those locations, if this is not already booked?    Tonette Lederer MD

## 2012-09-12 ENCOUNTER — Emergency Department (HOSPITAL_BASED_OUTPATIENT_CLINIC_OR_DEPARTMENT_OTHER)
Admission: RE | Admit: 2012-09-12 | Disposition: A | Discharge status: Home / Self Care | Payer: Self-pay | Source: Emergency Department | Attending: Emergency Medicine | Admitting: Emergency Medicine

## 2012-09-12 ENCOUNTER — Encounter (HOSPITAL_BASED_OUTPATIENT_CLINIC_OR_DEPARTMENT_OTHER): Payer: Self-pay | Admitting: OAS-Outpatient Addiction Svcs(PRV Practice 40)

## 2012-09-12 MED ORDER — DIPHENHYDRAMINE HCL 50 MG/ML IJ SOLN
25.00 mg | Freq: Once | INTRAMUSCULAR | Status: AC
Start: 2012-09-12 — End: 2012-09-12
  Administered 2012-09-12: 25 mg via INTRAVENOUS
  Filled 2012-09-12: qty 1

## 2012-09-12 MED ORDER — FAMOTIDINE IN NACL 20-0.9 MG/50ML-% IV SOLN
20.00 mg | Freq: Once | INTRAVENOUS | Status: AC
Start: 2012-09-12 — End: 2012-09-12
  Administered 2012-09-12: 20 mg via INTRAVENOUS
  Filled 2012-09-12: qty 50

## 2012-09-12 MED ORDER — FAMOTIDINE 20 MG PO TABS
20.00 mg | ORAL_TABLET | Freq: Two times a day (BID) | ORAL | Status: AC
Start: 2012-09-12 — End: 2012-09-19

## 2012-09-12 MED ORDER — PREDNISONE 50 MG PO TABS
50.00 mg | ORAL_TABLET | Freq: Every day | ORAL | Status: AC
Start: 2012-09-12 — End: 2012-09-17

## 2012-09-12 MED ORDER — EPINEPHRINE 0.3 MG/0.3ML AUTO-INJECTOR
0.3000 mg | Freq: Once | INTRAMUSCULAR | Status: AC | PRN
Start: 2012-09-12 — End: 2013-09-12

## 2012-09-12 MED ORDER — SODIUM CHLORIDE 0.9 % IV BOLUS
1000.00 mL | Freq: Once | INTRAVENOUS | Status: AC
Start: 2012-09-12 — End: 2012-09-12
  Administered 2012-09-12: 1000 mL via INTRAVENOUS

## 2012-09-12 MED ORDER — METHYLPREDNISOLONE SODIUM SUCC 125 MG IJ SOLR
125.00 mg | Freq: Once | INTRAMUSCULAR | Status: AC
Start: 2012-09-12 — End: 2012-09-12
  Administered 2012-09-12: 125 mg via INTRAVENOUS
  Filled 2012-09-12: qty 125

## 2012-09-12 NOTE — ED Notes (Signed)
Pt reports feeling better, no resp. Distress , lung sounds clear

## 2012-09-12 NOTE — ED Notes (Signed)
Patient Disposition    Patient education for diagnosis, medications, activity, diet and follow-up.  Patient left ED 6:13 PM.  Patient rep received written instructions.  Interpreter to provide instructions: No    Discharged to: Discharged to home

## 2012-09-12 NOTE — ED Notes (Signed)
Pt resting comfortable, denies sensation in throat or roof of mouth, pt states feeling better,

## 2012-09-12 NOTE — ED Triage Note (Signed)
Pt presents with ? Allergic reaction to pear. Pt started with redness, swelling to R side of throat after eating a pear about 1 hr ago. Pt demonstrates no resp distress. LS clear.

## 2012-09-12 NOTE — ED Notes (Signed)
Assumed pt care  No distress airway patent  Seen by dr Loreta Ave

## 2012-09-13 ENCOUNTER — Telehealth (HOSPITAL_BASED_OUTPATIENT_CLINIC_OR_DEPARTMENT_OTHER): Payer: Self-pay | Admitting: Licensed Practical Nurse

## 2012-09-13 NOTE — Progress Notes (Signed)
Linda Rodriguez is a 62 year old female who recently went to the Emergency Department with complaints of allergic reaction to pear. Unable to reach pt at this time. Left message on voicemail with clinic hours and phone number to call us back to f/u on ER visit.

## 2012-09-15 ENCOUNTER — Ambulatory Visit (HOSPITAL_BASED_OUTPATIENT_CLINIC_OR_DEPARTMENT_OTHER): Payer: HMO | Admitting: Physician Assistant

## 2012-09-16 NOTE — ED Provider Notes (Signed)
eMERGENCY dEPARTMENT eNCOUnter    I have reviewed the ED nursing notes and prior records. I have reviewed the patient's past medical history/problem list, allergies, social history and medication list    CHIEF COMPLAINT    Patient presents with:    Allergic Reaction - ALLERGIC REACTION TO PEAR      HPI    Linda Rodriguez is a 62 year old female who presents with allergic reaction 09/12/2012  2:55 PM.  The patient reports that she had a pair prior to arrival, and then began having some swelling of her throat on the right side hour after.  She complains of itching to her neck and hands bilaterally.  She has any difficulty breathing, or shortness of breath.  Denies any lightheadedness or dizziness.  She states she did not take any medication prior to arrival.  She states she still has an abnormal sensation in her throat.    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 Baltazar Apo "ECC inadequate but not suspicious clinically for abnormality; follow one year pap as planned." 3/09 Pap insufficient cells, HPV negative 9/09 Pap normal HPV positive repeat colpo after topical estrogens to facilitate ECC normal Pap 11/2008 WNL HPV negative, return to routine screening 03/2010@ patient request HPV negative, Pap no transformation zone, no rep    Elevated blood pressure reading without diagnosis of hypertension 05/17/2011    Comment: Most Recent BP Reading(s) 04/05/12 : 120/68 08/20/11 : 120/60 08/18/11 : 103/57    Patient reports February BP readings 110/54, 114/70 and states that since her life has become less stressful, her BP decreased.   Most Recent BP Reading(s) 01/27/11 : 148/72  04/08/10 : 118/60 05/02/09 : 112/64 11/20/08 : 110/68 10/30/08 : 112/60         SURGICAL HISTORY        Past Surgical History    NIPPLE/AREOLA RECONSTRUCTION  1995    Comment breast reduction    BREAST REDUCTION SURGERY         CURRENT MEDICATIONS    1. famotidine (PEPCID) 20 MG tablet  Route: Oral GNF:AOZH 1 tablet by mouth 2 (two) times daily.  Dispense: 14 tablet Refill: 0    2. EPINEPHrine (EPIPEN 2-PAK) 0.3 MG/0.3ML Injection Device  Route: Intramuscular YQM:VHQION 0.3 mg into the muscle once as needed.  Dispense: 2 each Refill: 1    3. predniSONE (DELTASONE) 50 MG tablet  Route: Oral GEX:BMWU 1 tablet by mouth daily. for 5 days.  Dispense: 5 tablet Refill: 0    4. Biotin 300 MCG TABS  Route: Oral XLK:GMWN 300 mg by mouth daily.  Dispense:  Refill:     5. Calcium Carbonate-Vitamin D (CALCIUM 600-D) 600-400 MG-UNIT TABS Tablet  Route: Oral UUV:OZDG 1 tablet by mouth daily. for prevention of osteoporosis  Dispense: 60 tablet Refill: 12    6. Cholecalciferol (VITAMIN D) 2000 UNIT CAPS  Route: Oral UYQ:IHKV 1 capsule by mouth daily.  Dispense: 1 capsule Refill: 0    7. PX ENTERIC ASPIRIN 81 MG OR TBEC  Route: Oral Sig:1 tablet by mouth  daily  Dispense:  Refill: 12    8. ATORVASTATIN CALCIUM 10 MG OR TABS  Route: Oral Sig:one every other day  Dispense:  Refill: 0    9. OTHER MEDICATION  Route:  RUE:AVWUJWJXBJYN 100 mg from [place] (a fibric acid)  one every other day  Dispense:  Refill: 0      ALLERGIES    Review of Patient's Allergies indicates:   Pear                    Anaphylaxis    Comment:Swelling of throat   Diclofenac potassium    Itching   Propoxyphene            Hives    Comment:After taking a propoxyphene/ASA combination             Tolerates Exedrin    FAMILY HISTORY      Family History    Heart Father     Comment: MI age 57    Osteoporosis Mother     Asthma Sister     Asthma Brother     Diabetes Brother     Tree surgeon Abuse Brother     Cataracts Father     Hypertension Sister     Hypertension Brother      Diabetes Brother     Cancer - Breast FamHxNeg     Cancer - Colon Sister        SOCIAL HISTORY    Social History    Marital Status: Single              Spouse Name:                       Years of Education:                 Number of children:               Occupational History  Occupation          Audiological scientist          NONE                    Social History Main Topics    Smoking Status: Never Smoker                      Alcohol Use: Yes           0.0 oz/week       Comment: less than weekly    Drug Use: No              Sexual Activity: Not Currently     Partners with: Female    Other Topics            Concern  Military Service        No  Blood Transfusions      No  Caffeine Concern        No  Occupational Exposure   No  Sleep Concern           No  Stress Concern          No  Weight Concern          No  Special Diet            No  Exercise  Yes    Comment:stopped zumba due to hip pain, plans to             restart  Seat Belt               Yes  Self-Exams              Yes    Social History Narrative    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  Pupils are equal, round and reactive to light and accommodation, extraocular movements are intact    Neck: Normal range of motion. No meningismus.  No significant swelling noted    Respiratory:  Clear to auscultation B/L.  No wheezes noted. No rales or rhonchi    Cardiovascular:  RRR. Nl s1, s2, no murmur, rubs or gallops    Abdominal:  Bowel sounds normal.  S/NT/ND.      Musculoskeletal:  Good range of motion in all joints, Strength  5/5 x 4 extremities    Skin:  Warm, Dry, No erythema, No rash.  Capillary Refill < 2 seconds      Neurological:  No focal deficits noted.   Moving all extremities. Normal sensation    Psych: No Homicidal or Suicidal Ideation      ED COURSE & MEDICAL DECISION MAKING    Pertinent Labs & Imaging studies reviewed.   This is a 62 year old female presented to the emergency department with questionable allergic reaction.  Given the patient's symptoms, but minimal findings on exam will be treated with IV fluids, steroids, Pepcid, Benadryl.    Labs Reviewed - No data to display  Medications   sodium chloride 0.9 % IV bolus 1,000 mL (0 mLs Intravenous Stopped 09/12/12 1701)   methylPREDNISolone sodium succinate (Solu-MEDROL) injection 125 mg (125 mg Intravenous Given 09/12/12 1603)   famotidine (PEPCID) IVPB 20 mg (20 mg Intravenous Given 09/12/12 1606)   diphenhydrAMINE (BENADRYL) injection 25 mg (25 mg Intravenous Given 09/12/12 1606)     Reevaluation patient she felt much better.  She was prescribed prednisone and Pepcid as an outpatient.  Patient will also prescribed an EpiPen.    CONDITION:  Stable, improved    DISPOSITION  Discharge home    FINAL IMPRESSION  Allergic reaction    FOLLOW UP PLAN:  Pt instructed to return to the Emergency Department if they have worsening symptoms or any concerning medical problems.     [place] Lyzbeth Genrich, DO  Emergency Department Attending Physician  [facility]    This Emergency Department patient encounter note was created using voice-recognition software and in real time during the ED visit. Please excuse any typographical errors that have not been edited out.

## 2012-09-22 ENCOUNTER — Encounter (HOSPITAL_BASED_OUTPATIENT_CLINIC_OR_DEPARTMENT_OTHER): Payer: Self-pay | Admitting: Family Medicine

## 2012-10-10 ENCOUNTER — Encounter (HOSPITAL_BASED_OUTPATIENT_CLINIC_OR_DEPARTMENT_OTHER): Payer: Self-pay | Admitting: Family Medicine

## 2012-10-10 DIAGNOSIS — C44311 Basal cell carcinoma of skin of nose: Secondary | ICD-10-CM | POA: Insufficient documentation

## 2012-10-23 ENCOUNTER — Telehealth (HOSPITAL_BASED_OUTPATIENT_CLINIC_OR_DEPARTMENT_OTHER): Payer: Self-pay | Admitting: Registered Nurse

## 2012-10-23 NOTE — Progress Notes (Addendum)
Linda Rodriguez is a 62 year old female who has noticed stye like symptoms since Friday. Swollen red area that has been treated with warm compresses. Patient denies fever, loss of vision, or headaches. Discussed information on styes as outlined below.   Encouraged use of black tea bag for warm compress to also use the Tannins in tea to relieve the infection.     Plan: patient to let us know if no improvement occurs and than an appointment should be booked for evaluation of area.     Styes are typically treated with warm packs (compresses) until drainage occurs.   If a hard lump has formed, it is generally necessary to do a small incision and remove the hardened contents of the cyst in a minor surgical procedure done in the office.   In suspicious cases, your caregiver may send the contents of the cyst to the lab to be certain that it is not a rare, but dangerous form of cancer of the glands of the eyelid.  HOME CARE INSTRUCTIONS   Wash your hands often and dry them with a clean towel. Avoid touching your eyelid. This may spread the infection to other parts of the eye.   Apply heat to your eyelid for 10 to 20 minutes, several times a day, to ease pain and help to heal it faster.   Do not squeeze the sty. Allow it to drain on its own. Wash your eyelid carefully 3 to 4 times per day to remove any pus.  SEEK IMMEDIATE MEDICAL CARE IF:   Your eye becomes painful or puffy (swollen).   Your vision changes.   Your sty does not drain by itself within 3 days.   Your sty comes back within a short period of time, even with treatment.   You have redness (inflammation) around the eye.   You have a fever.

## 2012-10-23 NOTE — Telephone Encounter (Signed)
Message copied by Kipp Laurence on Mon Oct 23, 2012 10:32 AM  ------       Message from: Madilyn Fireman       Created: Mon Oct 23, 2012 10:15 AM       Regarding: Questions re: Stye on R eye       Contact: [phone]                       Linda Rodriguez [id number], 62 year old, female, Telephone Information:       Home Phone      [phone]       Work Phone      Not on file.       Mobile          [phone]                     Patient's Preferred Pharmacy:               [facility] - [address] [place], [place] - [address]       Phone: [phone] Fax: [phone]                     CONFIRMED TODAY: No              CALL BACK NUMBER: [phone]       Best time to call back:        Cell phone:        Other phone:              Available times:              Patient's language of care: English              Patient does not need an interpreter.              Patient's PCP: Tonette Lederer MD              Person calling on behalf of patient: Patient (self)              Calls today with questions and concerns. Patient has a stye on R eye , has been doing warm compress but would like to know what else she could do , has an old ointment but not sure is she would be able to still use or get a new one.,                       ------

## 2012-10-24 ENCOUNTER — Ambulatory Visit (HOSPITAL_BASED_OUTPATIENT_CLINIC_OR_DEPARTMENT_OTHER): Payer: HMO | Admitting: Ophthalmology

## 2012-10-24 DIAGNOSIS — H00019 Hordeolum externum unspecified eye, unspecified eyelid: Principal | ICD-10-CM

## 2012-10-24 MED ORDER — ERYTHROMYCIN 5 MG/GM OP OINT
TOPICAL_OINTMENT | OPHTHALMIC | Status: AC
Start: 2012-10-24 — End: 2012-11-24

## 2012-10-24 NOTE — Nursing Note (Signed)
>>   [facility]     Tue Oct 24, 2012  4:15 PM  Pt here for emergency. C/o stye like symptoms swollen red bump RUL x 4 days that she has treated with warm compress but no drainage. She c.o. Soreness and itchiness. She now feels itchiness on the other lid as well.    Ocular Hx:  Hyperopia/presbyopia

## 2013-01-11 ENCOUNTER — Ambulatory Visit (HOSPITAL_BASED_OUTPATIENT_CLINIC_OR_DEPARTMENT_OTHER): Payer: HMO | Admitting: Internal Medicine

## 2013-04-10 ENCOUNTER — Encounter (HOSPITAL_BASED_OUTPATIENT_CLINIC_OR_DEPARTMENT_OTHER): Payer: Self-pay | Admitting: Emergency Medicine

## 2013-04-10 ENCOUNTER — Ambulatory Visit (HOSPITAL_BASED_OUTPATIENT_CLINIC_OR_DEPARTMENT_OTHER): Payer: HMO | Admitting: Emergency Medicine

## 2013-04-10 VITALS — BP 132/75 | HR 75 | Temp 96.8°F | Ht 61.0 in | Wt 115.0 lb

## 2013-04-10 DIAGNOSIS — Z1239 Encounter for other screening for malignant neoplasm of breast: Secondary | ICD-10-CM

## 2013-04-10 DIAGNOSIS — M25511 Pain in right shoulder: Principal | ICD-10-CM

## 2013-04-10 DIAGNOSIS — Z85828 Personal history of other malignant neoplasm of skin: Secondary | ICD-10-CM

## 2013-04-10 DIAGNOSIS — M25512 Pain in left shoulder: Principal | ICD-10-CM

## 2013-04-10 DIAGNOSIS — N183 Chronic kidney disease, stage 3 unspecified: Secondary | ICD-10-CM

## 2013-04-10 LAB — BASIC METABOLIC PANEL
ANION GAP: 7 mmol/L (ref 5–15)
BUN (UREA NITROGEN): 19 mg/dL — ABNORMAL HIGH (ref 7–18)
CALCIUM: 9.1 mg/dL (ref 8.5–10.1)
CARBON DIOXIDE: 30 mmol/L (ref 21–32)
CHLORIDE: 106 mmol/L (ref 98–107)
CREATININE: 0.8 mg/dL (ref 0.4–1.2)
ESTIMATED GLOMERULAR FILT RATE: >60 mL/min (ref >60)
Glucose Random: 89 mg/dL (ref 74–160)
POTASSIUM: 4.5 mmol/L (ref 3.5–5.1)
SODIUM: 143 mmol/L (ref 136–145)

## 2013-04-10 LAB — MAGNESIUM: MAGNESIUM: 2.4 mg/dL (ref 1.8–2.4)

## 2013-04-10 NOTE — Patient Instructions (Addendum)
Calcium supplement - [employer] - has calcium and magnesium    For heartburn once in while - consider - TUMS, or liquid maalox or mylanta - or rantidine (zantac).    Quinine water - will fix cramps in legs at night - 4 or 8 ounces.    balmex - white ointment - or nystatin ointment.    Ask Dermatologist about hair and biotin.

## 2013-04-10 NOTE — Progress Notes (Signed)
Linda Rodriguez is a 63 year old female  New patient - transfer   Due to multiple concerns and time constraints,  CPE is not done at this visit    Right shoulder pain -started suddenly - October 2014-   No recall of injury - started after bus trip to [place]..  Had left shoulder pain for two days - then moved to right side.  Tried ibuprofen - for few days only- bothers my stomach.  Was helpful for sleep at night.  Right handed.    Heart burn - not every day - when it comes if very bad -   nsaids bother stomach.  h pylori antibody positive - but stool test negative.  Was not treated.  Triggered by flour containing products., eating late at night.    Right calf cramping - at night -   Diarrhea x one week - watery -   Is getting better.    Taking magnesium 400 mg daily - was told this is important with calcium.  I agree - but she has est GFR 57 in 2012.  Hx CKD - eGFR 57 in 2012.    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 Lorelle Formosa "ECC inadequate but not suspicious clinically for abnormality; follow one year pap as planned." 3/09 Pap insufficient cells, HPV negative 9/09 Pap normal HPV positive repeat colpo after topical estrogens to facilitate ECC normal Pap 11/2008 WNL HPV negative, return to routine screening 03/2010@ patient request HPV negative, Pap no transformation zone, no rep    Elevated blood pressure reading without diagnosis of hypertension 05/17/2011    Comment: Most Recent BP Reading(s) 04/05/12 : 120/68 08/20/11 : 120/60 08/18/11 : 103/57    Patient reports February BP readings 110/54, 114/70 and states that since her life has  become less stressful, her BP decreased.   Most Recent BP Reading(s) 01/27/11 : 148/72 04/08/10 : 118/60 05/02/09 : 112/64 11/20/08 : 110/68 10/30/08 : 112/60           Past Surgical History    NIPPLE/AREOLA RECONSTRUCTION  1995    Comment breast reduction    BREAST REDUCTION SURGERY         Family History    Heart Father     Comment: MI age 25    Osteoporosis Mother     Asthma Sister     Asthma Brother     Diabetes Brother     Theatre stage manager Abuse Brother     Cataracts Father     Hypertension Sister     Hypertension Brother     Diabetes Brother     Cancer - Breast FamHxNeg     Cancer - Colon Sister      Social History    Marital Status: Single              Spouse Name:                       Years of Education:  Number of children:               Occupational History  Occupation          Tax inspector          NONE                    Social History Main Topics    Smoking Status: Never Smoker                      Alcohol Use: Yes           0.0 oz/week       Comment: less than weekly    Drug Use: No              Sexual Activity: Not Currently     Partners with: Female    Other Topics            Concern  Military Service        No  Blood Transfusions      No  Caffeine Concern        No  Occupational Exposure   No  Sleep Concern           No  Stress Concern          No  Weight Concern          No  Special Diet            No  Exercise                Yes    Comment:stopped zumba due to hip pain, plans to             restart  Seat Belt               Yes  Self-Exams              Yes    Social History Narrative    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  Constitutional: She is oriented to person, place, and time. She appears well-developed and well-nourished. No distress.   Cardiovascular: Normal rate and regular rhythm.    Pulmonary/Chest: Effort normal and breath sounds normal.   Abdominal: Soft.  There is no tenderness. There is no rebound.   Musculoskeletal:   Preserved ROM in shoulders   Neurological: She is alert and oriented to person, place, and time.   Skin: Skin is warm and dry.   Psychiatric: She has a normal mood and affect. Her behavior is normal.     ASSESSMENT/PLAN:  (719.41) Bilateral shoulder pain  (primary encounter diagnosis)  Plan: REFERRAL TO ORTHOPEDICS ( INT)          (V10.83) Hx of skin cancer, basal cell  Plan: REFERRAL TO DERMATOLOGY (EXT)          (585.3) CKD (chronic kidney disease) stage 3, GFR 30-59 ml/min  Comment: reduced GFR 2012 - and on magnesium   Plan: BASIC METABOLIC PANEL, COLLECTION VENOUS BLOOD         VENIPUNCTURE, MAGNESIUM          (V76.10) Screening for breast  cancer  Plan: [facility] MAMMOGRAPHY SCREENING BILATERAL W CAD

## 2013-04-12 ENCOUNTER — Encounter (HOSPITAL_BASED_OUTPATIENT_CLINIC_OR_DEPARTMENT_OTHER): Payer: Self-pay | Admitting: Emergency Medicine

## 2013-04-22 NOTE — Progress Notes (Signed)
Quick Note:    Dear Linda Rodriguez are your recent blood tests.    Your kidney function is normal.   Your potassium and magnesium levels are normal.    Please call the clinic if you have any questions.    Sincerely,      Rhona Raider, MD    ______

## 2013-04-24 ENCOUNTER — Ambulatory Visit (HOSPITAL_BASED_OUTPATIENT_CLINIC_OR_DEPARTMENT_OTHER): Payer: HMO | Admitting: Orthopaedic Surgery

## 2013-04-24 DIAGNOSIS — M25512 Pain in left shoulder: Principal | ICD-10-CM

## 2013-04-24 DIAGNOSIS — M25511 Pain in right shoulder: Principal | ICD-10-CM

## 2013-04-24 LAB — XR SHOULDER LEFT MINIMUM 2 VIEWS

## 2013-04-24 LAB — XR SHOULDER RIGHT MINIMUM 2 VIEWS

## 2013-04-24 MED ORDER — NABUMETONE 500 MG PO TABS
500.0000 mg | ORAL_TABLET | Freq: Two times a day (BID) | ORAL | Status: DC
Start: 2013-04-24 — End: 2014-07-10

## 2013-04-24 NOTE — Progress Notes (Signed)
Date of Service: 04/24/2013    SUBJECTIVE:  The patient comes in because of pain in both shoulders.  She said this started with the left shoulder about a year ago after a biopsy for skin cancer, and because it was sore she did not move it for a while, and then she noticed it was getting stiff and sore even after the pain from the biopsy cleared up, and she had some difficulty with it, and then after a few months she noticed that the right arm started getting sore, as well, and they were both somewhat sore and somewhat stiff.  She exercised and worked on them, and they did get somewhat better, but she still has some discomfort with the extremes of range of motion.  It is not severe pain.  It does not bother her that much that she has not been able to function, and she works as an Equities traderinterpreter, and there has been no problem with that, but it still is bothering her most every day, particularly if she sleeps on one side or the other she will get some shoulder pain, so she wanted to see if there is something else that could be done.    ALLERGIES:  She is allergic to diclofenac, pears, and propoxyphene.    CURRENT MEDICATIONS:  Include calcium and vitamin D., enteric-coated aspirin, atorvastatin.    PAST MEDICAL HISTORY:  Her problems include a mitral valve disorder, but she says that is not a serious problem.  She has had a kidney stone.  She has had some hyperlipidemia, vitamin D deficiency, slightly elevated hemoglobin A1c, but she is not diabetic.  She has had a history of basal cell carcinoma.    SOCIAL HISTORY:  As mentioned, she works as an Equities traderinterpreter.  She is single and lives in [place].  There is a family history of diabetes.    OBJECTIVE:  She is an average-sized female in no acute distress.  She stands and walks with a normal gait.  There is no obvious deformity in her posture.  Examination of her shoulders shows on inspection no swelling, no erythema, no rashes, no masses, no spasm, no discoloration, and no  deformity.  She has no tenderness to palpation anywhere around her shoulders.  Examination of the right shoulder shows her to have a full range of motion, but she gets some pain with complete forward elevation, some pain with abduction and external rotation, some pain with complete internal rotation, but again the pain is mild to moderate.  There are good pulses in her wrist.  There is no pain below the elbow.  There is no evidence of radiating-type pain or neurological symptoms.  Examination of the left shoulder shows even less pain.  She has full range of motion of the left shoulder with less pain at the extremes of every range.  She does get some pain with rotating her neck to the right, but she actually has full range of motion.  Rotating her neck to the left does not bother her, but she has negative Spurling's test, does not give her pain in her shoulder, just a little bit in the side of her neck.  There are no sensory, motor, or reflex deficits noted in either upper extremity.  She has negative Tinel's sign at the wrist.  She has good grip strength.    IMAGING:  X-rays of her shoulders are within normal limits.    ASSESSMENT:  Probably some mild subacromial bursitis in both of her shoulders.  PLAN:  We are going to put her on some Relafen 500 mg p.o. b.i.d. and have her go to physical therapy.  Although she has a history of being allergic to diclofenac, she has taken Motrin in the past without any allergic reaction.    ___________________________  Reviewed and Electronically Signed By: Vertell Limber MD  Sig Date: 04/24/2013  Sig Time: 10:57:04  Dictated By: Vertell Limber MD  Dict Date: 04/24/2013 Dict Time: 09 25 AM    Dictation Date and Time:04/24/2013 09:25:25  Transcription Date and Time:04/24/2013 09:54:33  eScription Dictation id: [id number] Confirmation # :[id number]

## 2013-04-24 NOTE — Progress Notes (Signed)
The primary progress note for this visit has been dictated through E-Scription. It can be viewed as an attachment to this encounter or through Chart Review under the Notes Tab as an Orthopedic Office Note.      Review of Systems: Constitutional, Eyes, ENT/Mouth, Cardiovascular, Respiratory, GI, GU, Neuro, Psych, Heme/Lymph, Skin, Musculoskeletal was reviewed and is NEGATIVE except for what is dictated in the note.    MT ACCT #:  [id number]

## 2013-04-27 ENCOUNTER — Ambulatory Visit (HOSPITAL_BASED_OUTPATIENT_CLINIC_OR_DEPARTMENT_OTHER): Payer: HMO | Admitting: Rehabilitative and Restorative Service Providers"

## 2013-04-27 DIAGNOSIS — M25511 Pain in right shoulder: Principal | ICD-10-CM

## 2013-04-27 DIAGNOSIS — M25512 Pain in left shoulder: Principal | ICD-10-CM

## 2013-04-27 NOTE — Progress Notes (Signed)
Outpatient Physical Therapy Initial Evaluation  [facility]: [facility]    Linda Rodriguez is a 63 year old female who presents to [facility] with complaints of bilateral shoulder pain.   Reports initial onset of L shoulder pain starting @ 5 months ago after period of decreased use of L Arm ( had skin biopsy with sutures )  Then began developing Right shoulder pain.    Patient reports symptoms increase with use of UE esp for IR and abd > 90. and decrease with rest arms at side.. Pain is located in bilateral shoulders and is described as sharp.     Patient learns best by Demonstration and Verbal.    Function  ADLs:  Pt reports pain with donning pants and reaching back    Work/School Duties:  Works as Astronomer at [employer]:  Impaired  Pt reports she prefers to sleep in side lying, but has increased pain.     Recreational Activities:  Pt was attending Zumba and Pilates classes regularly prior to onset of Shoulder pain.  Now with decreased tolerance    Patient Active Problem List:  Patient Active Problem List:     MITRAL VALVE DISORDER     CALCULUS OF KIDNEY     HYPERLIPIDEMIA NEC/NOS     Family history of diabetes mellitus     Postmenopausal Atrophic Vaginitis     Generalized Osteoarthrosis, Involving Hand     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     Atypical Chest Pain     Vitamin d deficiency     Hyperopia     Blepharitis     Elevated hemoglobin A1c     Special screening for malignant neoplasms, colon     Family history of malignant neoplasm of gastrointestinal tract     Basal cell carcinoma of nose        Medications:    Current Outpatient Prescriptions on File Prior to Visit:  nabumetone (RELAFEN) 500 MG tablet Take 1 tablet by mouth 2 (two) times daily. Take one tablet every 12 hours Disp: 60 tablet Rfl: 2   EPINEPHrine (EPIPEN 2-PAK) 0.3 MG/0.3ML Injection Device Inject 0.3 mg into the muscle once as needed. Disp: 2 each Rfl: 1   Calcium Carbonate-Vitamin  D (CALCIUM 600-D) 600-400 MG-UNIT TABS Tablet Take 1 tablet by mouth daily. for prevention of osteoporosis Disp: 60 tablet Rfl: 12   Cholecalciferol (VITAMIN D) 2000 UNIT CAPS Take 1 capsule by mouth daily. Disp: 1 capsule Rfl: 0   PX ENTERIC ASPIRIN 81 MG OR TBEC 1 tablet by mouth daily Disp:  Rfl: 12   ATORVASTATIN CALCIUM 10 MG OR TABS one every other day Disp:  Rfl: 0   OTHER MEDICATION ciprofibrate 100 mg from [place] (a fibric acid)  one every other day Disp:  Rfl: 0     No current facility-administered medications on file prior to visit.       04/27/13 0934   Language Information   Language of Care English   Interpreter No   Rehab Discipline   Rehab Discipline PT   Visit   Visit number 1   POC Due date 05/25/13   Pain   Pain Score 0   (no pain at rest 6-7 with motion)   Precautions   Precautions No   Living Situation   Lives With Alone   Posture   Forward Head Minimal   Scapular Winging Minimal   Posture assessment X  Cervical Assessment   Flexion 50   Extension 55   L Rotation full   R Rotation 75%   L Side bend 25  (contralateral stretch)   R Side bend 25  (unilateral pain)   LUE PROM (degrees)   L Shoulder Flexion  0-170 170 Degrees   L Shoulder Extension  0-60 38 Degrees   L Shoulder ABduction 0-40 170 Degrees   L Shoulder Internal Rotation  0-70 75 Degrees   L Shoulder External Rotation  0-90 90 Degrees   LUE Strength   L Shoulder Flexion 5/5   L Shoulder Extension 5/5   L Shoulder ABduction 4/5   L Shoulder Internal Rotation 4/5   L Shoulder External Rotation 4-/5   L Shoulder Horizontal ABduction 4-/5   L Shoulder Horizontal ADduction 5/5   RUE PROM (degrees)   R Shoulder Flexion  0-170 170 Degrees   R Shoulder Extension 0-60 40 Degrees   R Shoulder ABduction 0-140 170 Degrees   R Shoulder Internal Rotation  0-70 60 Degrees   R Shoulder External Rotation  0-90 95 Degrees   RUE Strength   R Shoulder Flexion 4+/5   R Shoulder Extension 4/5   R Shoulder ABduction 4/5   R Shoulder Internal Rotation 4/5   R  Shoulder External Rotation 4-/5   R Shoulder Horizontal ABduction 4-/5   Left Shoulder results   L O'Brien's Negative   L Speeds Negative   L Lift Off Negative   L Apprehension Negative   Right Shoulder results   R Shoulder O'Brien's Negative   R Speeds Negative   R Lift Off  Negative   R Apprehension Negative   Palpation   Increased Tissue Density upper trap R > L   Right Shoulder Glenohumeral   R Glenohumeral Anterior 3/6   R Glenohumeral Posterior 2/6   R Glenohumeral inferior 2/6   Left Shoulder Glenohumeral   L Glenohumeral Anterior 3/6   L Glenohumeral Posterior 2/6   L Glenohumeral inferior 2/6   Patient Education   What was taught? PT POC / HEP   Method Verbal   Patient comprehension Yes         PT  Physical Therapy Plan of Care    ZO:XWRUE Loralee Pacas, MD  Referring Provider: Edwyna Ready, MD    Diagnosis: Shoulder pain, bilateral  (primary encounter diagnosis)    Assessment/Objective Findings:   Patient is a 63 year old female with complains of pain in her bilateral Shoulder.     Pt reports onset of pain starting @ 5 months ago with initial pain in L  Shoulder after period of restricted movement and then developed Right shoulder pain. . 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 Pt expresses long term goal of returning to baseline, is motivated to work towards this in PT.    Clinical presentation today is consistent with shoulder impingement and decreased scapula stabiliation and pt will benefit from skilled PT focused on strengthening and stabilization exercise  to address the following problems and impairments noted upon evaluation: Pain, Decreased Strength, Decreased Functional Mobility, Decreased Joint Mobility and Decreased Tolerance of ADLs.     These problems limit the patient with the following functional activities: regular exercise program and use of UE's esp with IR/ ext as well as difficulty  sleeping.. The prescribed treatment plan of care is medically necessary.        Medicare Related Problems:  Short Term Functional Goals: 2 weeks.   Pt will be independent with initial HEP  Pt will have decrease in upper trap mm guarding    Long Term Goal: 6 weeks.   Pt will report decrease in pain level to - 2 on pain scale  Pt will demonstrate norma shoulder mechanics with good scapula stability  Pt will be independent with HEP for strengthening and stabilization program  Pt will return to PLOF    Treatment Plan:  ** Stretching/ROM Exercise  ** Therapeutic Exercise  ** Home Exercise Program  ** Joint Mobilization  ** Soft Tissue Mobilization  ** Hot/Cold Rx  ** Functional Activities  ** Patient Education  ** Taping    Recommend Physical Therapy be continued 2 times per week for 6 weeks.  The rehabilitation potential for this patient is excellent    Patient Meryle Ready/Family is aware of attendance policy: Yes  Plan of care discussed with Patient/Family: Yes  Patient goals reviewed and incorporated in plan of care: Yes  Patient/Family agrees with plan of care: Yes  Patient/Family education: Yes  Does patient feel safe at home: Yes      Sandi Ravelingracy Aydin Hink,PT

## 2013-04-27 NOTE — Progress Notes (Signed)
04/27/13 0934   Language Information   Language of Care English   Interpreter No   Precautions   Precautions No   Rehab Discipline   Rehab Discipline PT   Visit   Visit number 1   POC Due date 05/25/13   Time Calculation   Start Time 0830   Stop Time 0925   Time Calculation (min) 55 min   Pain   Pain Score 0   (no pain at rest 6-7 with motion)   Ther Exercise   Exercise initiated horizonta abd with YTB   Reps 10   Sets 2   Patient Education   What was taught? PT POC / HEP   Method Verbal   Patient comprehension Yes

## 2013-04-27 NOTE — Progress Notes (Signed)
I certify that the documented Treatment Plan is reasonable and necessary.    04/27/2013  Aissata Wilmore, MD

## 2013-05-02 ENCOUNTER — Ambulatory Visit (HOSPITAL_BASED_OUTPATIENT_CLINIC_OR_DEPARTMENT_OTHER): Payer: HMO | Admitting: Rehabilitative and Restorative Service Providers"

## 2013-05-02 DIAGNOSIS — M25511 Pain in right shoulder: Principal | ICD-10-CM

## 2013-05-02 DIAGNOSIS — M25512 Pain in left shoulder: Principal | ICD-10-CM

## 2013-05-02 NOTE — Progress Notes (Signed)
S: Pain with movement 5/10  O: Refer to Rehabilitation Treatment Flowsheet  A: Pt having difficult with IR B'ly R> L .  Pt was able to gain range after Contract relax technique  P: Continue per plan of care.

## 2013-05-02 NOTE — Progress Notes (Signed)
05/02/13 1024   Language Information   Language of Care English   Interpreter No   Precautions   Precautions No   Rehab Discipline   Rehab Discipline PT   Visit   Visit number 2   POC Due date 05/25/13   Time Calculation   Start Time 0830   Stop Time 0900   Time Calculation (min) 30 min   Pain   Pain Score 5    Manual Therapy   Technique PA glide   Time in minutes 6   Manual Therapy 2   Technique PNF   Time in minutes 6   Ther Exercise   Exercise Standing Scap squeezes   Reps 10   Sets 2   Ther Exercise 2   Exercise Standing corner stretch   Reps 2 3   Holds 2 30 secs   Patient Education   What was taught? PT HEP   Method Verbal   Patient comprehension Yes

## 2013-05-11 ENCOUNTER — Ambulatory Visit (HOSPITAL_BASED_OUTPATIENT_CLINIC_OR_DEPARTMENT_OTHER): Payer: HMO | Admitting: Rehabilitative and Restorative Service Providers"

## 2013-05-11 DIAGNOSIS — M25511 Pain in right shoulder: Principal | ICD-10-CM | POA: Insufficient documentation

## 2013-05-11 NOTE — Progress Notes (Signed)
05/11/13 0849   Precautions   Precautions No   Rehab Discipline   Rehab Discipline PT   Visit   Visit number 2   POC Due date 05/25/13   Time Calculation   Start Time 0730   Stop Time 0805   Time Calculation (min) 35 min   Pain   Pain Score 6    Manual Therapy   Technique PA glide/inf glide   Time in minutes 6   Manual Therapy 2   Technique PNF   Time in minutes 6   Manual Therapy 3   Technique CFM   Time in minutes 3   Ther Exercise   Exercise Standing Scap squeezes   Reps 10   Sets 2   Ther Exercise 2   Exercise Standing corner stretch   Reps 2 3   Holds 2 30 secs   Ther Exercise 3   Exercise 3 IR/ER 4 lb pulleys   Reps 3 10   Ther Exercise 4   Exercise 4 Pulley 6lb pull downs    Reps 4 10   Sets 4 2   Ther Exercise 5   Exercise 5 Pulley 6lb high rows   Reps 5 10   Sets 5 2   Patient Education   What was taught? PT HEP   Method Verbal   Patient comprehension Yes

## 2013-05-11 NOTE — Progress Notes (Signed)
S: 6/10  O: Refer to Rehabilitation Treatment Flowsheet  A: Pt still limited into IR.  Able to progress some ROM after modalities and PNF techniques.  Reviewed therex and stretching and given handouts  P: Continue per plan of care.

## 2013-05-14 ENCOUNTER — Ambulatory Visit (HOSPITAL_BASED_OUTPATIENT_CLINIC_OR_DEPARTMENT_OTHER): Payer: HMO | Admitting: Rehabilitative and Restorative Service Providers"

## 2013-05-14 DIAGNOSIS — M25511 Pain in right shoulder: Principal | ICD-10-CM

## 2013-05-14 NOTE — Progress Notes (Signed)
S: Pt reports she only has pain with moving arm   States her neck has been tight for along time    O: Refer to Rehabilitation Treatment Flowsheet    A: Pt with good tolerance to exercise program.  Reviewed strengthening program for RTC and mid and lower traps.  May benefit from trial of kinesio tape for upper trap inhibition - declined today due to time constraints.    P: Continue as per Plan of Care- trial of kinesio tape next visit.

## 2013-05-14 NOTE — Progress Notes (Signed)
05/14/13 0847   Language Information   Language of Care English   Interpreter No   Precautions   Precautions No   Rehab Discipline   Rehab Discipline PT   Visit   Visit number 4   POC Due date 05/25/13   Time Calculation   Start Time 0800   Stop Time 0830   Time Calculation (min) 30 min   Pain   Pain Score 0    Manual Therapy   Technique GH jt mobs for inferior and PA glides   Time in minutes 5   Ther Exercise   Exercise prone y lifts   Reps 10   Sets 3   Ther Exercise 2   Exercise high rows with 17#   Reps 2 10   Sets 2 3   Ther Exercise 3   Exercise 3 S/L ER with 2#   Reps 3 10   Sets 3 3   Ther Exercise 4   Exercise 4 IR with RTB   Reps 4 10   Sets 4 3   Ther Exercise 5   Exercise 5 eccentric biceps with manual resistance   Reps 5 12   Ther Exercise 6   Exercise 6 UBE 1 1/2 min fwd and bckd   Patient Education   What was taught? HEP   Method Verbal;Demo;Practice   Patient comprehension Yes

## 2013-05-18 ENCOUNTER — Ambulatory Visit (HOSPITAL_BASED_OUTPATIENT_CLINIC_OR_DEPARTMENT_OTHER): Payer: HMO | Admitting: Rehabilitative and Restorative Service Providers"

## 2013-05-18 DIAGNOSIS — M25511 Pain in right shoulder: Principal | ICD-10-CM

## 2013-05-18 NOTE — Progress Notes (Signed)
S: Pt with no pain today  O: Refer to Rehabilitation Treatment Flowsheet  A: Pt progressing well.  IR ROM still biggest issue but making gains  P: Continue per plan of care.

## 2013-05-18 NOTE — Progress Notes (Signed)
05/18/13 1000   Language Information   Language of Care English   Interpreter No   Precautions   Precautions No   Rehab Discipline   Rehab Discipline PT   Visit   Visit number 5   POC Due date 05/25/13   Time Calculation   Start Time 0730   Stop Time 0800   Time Calculation (min) 30 min   Pain   Pain Score 0    Manual Therapy   Technique GH jt mobs for inferior and PA glides   Time in minutes 5   Manual Therapy 2   Technique PNF   Time in minutes 6   Ther Exercise   Exercise A   Ther Exercise 2   Reps 2 10   Sets 2 2   Ther Exercise 3   Exercise 3 IR/ER 4lb pulley   Reps 3 10   Sets 3 2   Ther Exercise 4   Exercise 4 R shoulder ext RTB   Reps 4 10   Sets 4 2   Ther Exercise 5   Exercise 5 Lower trap strengthening RTB shoulder ext while fully flexed   Reps 5 10   Sets 5 2   Modalities   Type of modalities Hot pack   Hot pack   Joints Right;Shoulder   Position Seated;Prone   Time in minutes 10   Parameters pre-rx

## 2013-05-21 ENCOUNTER — Ambulatory Visit (HOSPITAL_BASED_OUTPATIENT_CLINIC_OR_DEPARTMENT_OTHER): Payer: HMO | Admitting: Rehabilitative and Restorative Service Providers"

## 2013-05-21 DIAGNOSIS — M25511 Pain in right shoulder: Principal | ICD-10-CM

## 2013-05-21 NOTE — Progress Notes (Signed)
05/21/13 1521   Language Information   Language of Care English   Interpreter No   Precautions   Precautions No   Rehab Discipline   Rehab Discipline PT   Visit   Visit number 6   Time Calculation   Start Time 0800   Stop Time 0830   Time Calculation (min) 30 min   Pain   Pain Score 6    Manual Therapy   Technique STM for B paracervical mm   Time in minutes 10   Manual Therapy 2   Technique sub-occipital release    Time in minutes 4 min   Ther Exercise   Exercise CROM with manual stretch   Reps 2   Holds 15 sec   Other   Miscellaneous kinesio tape I strip for upper trap inhibition   Patient Education   What was taught? reviewed HEP   Method Verbal   Patient comprehension Yes

## 2013-05-21 NOTE — Progress Notes (Signed)
S: Pt reports she has increased eck and upper trap pain today  May have slept wrong.  She rates pain at 6/10.    O: Refer to Rehabilitation Treatment Flowsheet    A: Pt with moderate mm guarding of B upper traps / levator scap  She had good tolerance to STM and cervical stretch / sub-occipital release.  Continues with HEP    P: Continue as per Plan of Care

## 2013-05-23 ENCOUNTER — Ambulatory Visit: Payer: Self-pay | Admitting: Emergency Medicine

## 2013-05-23 DIAGNOSIS — Z1239 Encounter for other screening for malignant neoplasm of breast: Principal | ICD-10-CM

## 2013-05-25 ENCOUNTER — Ambulatory Visit (HOSPITAL_BASED_OUTPATIENT_CLINIC_OR_DEPARTMENT_OTHER): Payer: HMO | Admitting: Rehabilitative and Restorative Service Providers"

## 2013-05-25 DIAGNOSIS — M25511 Pain in right shoulder: Principal | ICD-10-CM

## 2013-05-25 NOTE — Progress Notes (Signed)
05/25/13 0946   Language Information   Language of Care English   Interpreter No   Precautions   Precautions No   Rehab Discipline   Rehab Discipline PT   Visit   Visit number 7   Time Calculation   Start Time 0730   Stop Time 0800   Time Calculation (min) 30 min   Pain   Pain Score 0   (4/10)   Manual Therapy   Technique STM for B paracervical mm   Time in minutes 10   Ther Exercise   Exercise AAROM B shoulders all directions   Reps 10   Sets 1   Ther Exercise 2   Exercise corner stretch   Reps 2 3   Holds 2 30   Ther Exercise 3   Exercise 3 Isometic shoulder with ball 4 way B   Reps 3 10   Sets 3 1   Holds 3 5   Modalities   Type of modalities Hot pack   Hot pack   Joints Right;Shoulder   Position Prone   Time in minutes 10   Patient Education   What was taught? reviewed HEP   Method Verbal   Patient comprehension Yes

## 2013-05-25 NOTE — Progress Notes (Signed)
S: 0/10 at rest 4/10 with movement  O: Refer to Rehabilitation Treatment Flowsheet  A: Pt states her functional movements like washing her back are improving.  B shoulders are painful  P: Continue per plan of care.

## 2013-05-28 ENCOUNTER — Ambulatory Visit (HOSPITAL_BASED_OUTPATIENT_CLINIC_OR_DEPARTMENT_OTHER): Payer: HMO | Admitting: Rehabilitative and Restorative Service Providers"

## 2013-05-28 DIAGNOSIS — M25512 Pain in left shoulder: Principal | ICD-10-CM

## 2013-05-28 DIAGNOSIS — M25511 Pain in right shoulder: Principal | ICD-10-CM

## 2013-05-28 NOTE — Progress Notes (Signed)
05/28/13 1257   Language Information   Language of Care English   Interpreter No   Precautions   Precautions No   Rehab Discipline   Rehab Discipline PT   Visit   Visit number 8   POC Due date 06/29/13   Time Calculation   Start Time 0800   Stop Time 0830   Time Calculation (min) 30 min   Pain   Pain Score 0   (5-6 with certain positions)   Ther Exercise   Exercise ROM and mm testing   Ther Exercise 2   Exercise ube x 1.5 minfwd and bck   Ther Exercise 3   Exercise 3 prone Y lifts on ball   Reps 3 10   Sets 3 2   Ther Exercise 4   Exercise 4 planks   Reps 4 3   Holds 4 20 sec

## 2013-05-28 NOTE — Progress Notes (Signed)
I certify that the documented Treatment Plan is reasonable and necessary.    05/28/2013  Honorio Devol, PA-C

## 2013-05-28 NOTE — Progress Notes (Signed)
Outpatient Physical Therapy Initial Evaluation  [facility]    Linda Rodriguez is a 63 year old female who presents to [facility] Outpatient [facility] Therapy with complaints of bilateral shoulder pain.   Reports initial onset of L shoulder pain starting @ 5 months ago after period of decreased use of L Arm ( had skin biopsy with sutures )  Then began developing Right shoulder pain.  She has now received 8 PT treatments to date and reports increased tolerance to activity and ROM, but still has pain with certain positions of her parm.    Patient reports symptoms increase with use of UE esp for IR and abd > 90. and decrease with rest arms at side.. Pain is located in bilateral shoulders and is described as sharp.     Patient learns best by Demonstration and Verbal.    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  Now with decreased tolerance    Patient Active Problem List:  Patient Active Problem List:     MITRAL VALVE DISORDER     CALCULUS OF KIDNEY     HYPERLIPIDEMIA NEC/NOS     Family history of diabetes mellitus     Postmenopausal Atrophic Vaginitis     Generalized Osteoarthrosis, Involving Hand     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     Atypical Chest Pain     Vitamin d deficiency     Hyperopia     Blepharitis     Elevated hemoglobin A1c     Special screening for malignant neoplasms, colon     Family history of malignant neoplasm of gastrointestinal tract     Basal cell carcinoma of nose     Right shoulder pain        Medications:    Current Outpatient Prescriptions on File Prior to Visit:  nabumetone (RELAFEN) 500 MG tablet Take 1 tablet by mouth 2 (two) times daily. Take one tablet every 12 hours Disp: 60 tablet Rfl:  2   EPINEPHrine (EPIPEN 2-PAK) 0.3 MG/0.3ML Injection Device Inject 0.3 mg into the muscle once as needed. Disp: 2 each Rfl: 1   Calcium Carbonate-Vitamin D (CALCIUM 600-D) 600-400 MG-UNIT TABS Tablet Take 1 tablet by mouth daily. for prevention of osteoporosis Disp: 60 tablet Rfl: 12   Cholecalciferol (VITAMIN D) 2000 UNIT CAPS Take 1 capsule by mouth daily. Disp: 1 capsule Rfl: 0   PX ENTERIC ASPIRIN 81 MG OR TBEC 1 tablet by mouth daily Disp:  Rfl: 12   ATORVASTATIN CALCIUM 10 MG OR TABS one every other day Disp:  Rfl: 0   OTHER MEDICATION ciprofibrate 100 mg from [place] (a fibric acid)  one every other day Disp:  Rfl: 0     No current facility-administered medications on file prior to visit.       05/28/13 1257   Language Information   Language of Care English   Interpreter No   Rehab Discipline   Rehab Discipline PT   Visit   Visit number 8   POC Due date 06/29/13   Pain   Pain Score 0   (5-6 with certain positions)   Precautions   Precautions No  Cervical Assessment   Flexion 50   Extension 55   L Rotation full   R Rotation 75%   L Side bend 40   R Side bend 32   LUE PROM (degrees)   L Shoulder Flexion  0-170 170 Degrees   L Shoulder Extension  0-60 45 Degrees   L Shoulder ABduction 0-40 170 Degrees   L Shoulder Internal Rotation  0-70 70 Degrees   L Shoulder External Rotation  0-90 92 Degrees   LUE Strength   L Shoulder Flexion 5/5   L Shoulder Extension 5/5   L Shoulder ABduction 4+/5   L Shoulder Internal Rotation 4+/5   L Shoulder External Rotation 4/5   L Shoulder Horizontal ABduction 4/5   RUE PROM (degrees)   R Shoulder Flexion  0-170 170 Degrees   R Shoulder Extension 0-60 45 Degrees   R Shoulder ABduction 0-140 170 Degrees   R Shoulder Internal Rotation  0-70 70 Degrees   R Shoulder External Rotation  0-90 95 Degrees   RUE Strength   R Shoulder Flexion 5/5   R Shoulder Extension 4+/5   R Shoulder ABduction 4/5   R Shoulder Internal Rotation 4+/5   R Shoulder External Rotation 4/5   R Shoulder  Horizontal ABduction 4/5   Palpation   Tenderness to Palpation slightly TTP LHBT   Right Shoulder Glenohumeral   R Glenohumeral Anterior 3/6   R Glenohumeral Posterior 2/6   R Glenohumeral inferior 3/6   Left Shoulder Glenohumeral   L Glenohumeral Anterior 3/6   L Glenohumeral Posterior 2/6   L Glenohumeral inferior 3/6   Patient Education   What was taught? updated POC / HEP   Method Verbal;Practice   Patient comprehension Yes     Pt does c/o R central cervical pain with cervical compression and R Spurllings test          Physical Therapy Plan of Care    ZY:SAYTK Marcene Brawn, MD  Referring Provider: Vertell Limber, MD    Diagnosis: Pain in joint, shoulder region    Assessment/Objective Findings:   Patient is a 63 year old female with complains of pain in her bilateral Shoulder.     Pt reports onset of pain starting @ 5 months ago with initial pain in L  Shoulder after period of restricted movement and then developed Right shoulder pain. . 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 Pt expresses long term goal of returning to baseline, is motivated to work towards this in PT.  She has received 8 PT treatments to date and reports overall improvement with activity, but still with certain movements causing pain esp extension /, abd and IR.    Pt reports increased tolerance and she does demonstrate increase in ROM and improving strength, but still with some scapula and RTC weakness noted.   Pt will benefit from continued skilled PT focused on strengthening and stabilization exercise  to address the following problems and impairments noted upon evaluation: Pain, Decreased Strength, Decreased Functional Mobility, Decreased Joint Mobility and Decreased Tolerance of ADLs.     These problems limit the patient with the following functional activities: regular exercise program and use of UE's esp with IR/ ext . The prescribed treatment  plan of care is medically necessary.        Medicare Related Problems:    Short Term Functional Goals: 2 weeks   Pt will be independent with HEP  MET  Pt will have decrease in upper trap mm guarding  Progressing  Pt will have increase in pain free CROM    Long Term Goal: 4 weeks  Pt will report decrease in pain level to - 2 on pain scale  Pt will demonstrate norma shoulder mechanics with good scapula stability  Pt will be independent with HEP for strengthening and stabilization program  Pt will return to PLOF    Treatment Plan:  ** Stretching/ROM Exercise  ** Therapeutic Exercise  ** Home Exercise Program  ** Joint Mobilization  ** Soft Tissue Mobilization  ** Hot/Cold Rx  ** Functional Activities  ** Patient Education  ** Taping    Recommend Physical Therapy be continued 2 times per week for 4 weeks.  The rehabilitation potential for this patient is excellent    [name] is aware of attendance policy: Yes  Plan of care discussed with Patient/Family: Yes  Patient goals reviewed and incorporated in plan of care: Yes  Patient/Family agrees with plan of care: Yes  Patient/Family education: Yes  Does patient feel safe at home: Yes      Dorita Fray

## 2013-05-29 NOTE — Addendum Note (Signed)
Addended by: Rowan Blase on: 05/29/2013 05:22 PM     Modules accepted: Orders

## 2013-05-30 LAB — MA SCREENING MAMMO BILATERAL WITH CAD

## 2013-05-30 NOTE — Progress Notes (Signed)
Quick Note:    Dear Linda Rodriguez    Thank you for getting your mammogram done!    Your mammogram is normal, and will need to be repeated in one year.    Sincerely,      Rhona Raider, MD    ______

## 2013-06-01 ENCOUNTER — Ambulatory Visit (HOSPITAL_BASED_OUTPATIENT_CLINIC_OR_DEPARTMENT_OTHER): Payer: HMO | Admitting: Rehabilitative and Restorative Service Providers"

## 2013-06-01 DIAGNOSIS — M25511 Pain in right shoulder: Principal | ICD-10-CM

## 2013-06-01 NOTE — Progress Notes (Signed)
S: Pt states she is so happy with the increased motion of her right arm.  Dressing and bathing has been easier  O: Refer to Rehabilitation Treatment Flowsheet  A: Pt tolerate progression of strengthening ex's.  Core recruitment discussed and educated during UE exercises  P: Continue per plan of care.

## 2013-06-01 NOTE — Progress Notes (Signed)
06/01/13 0900   Language Information   Language of Care English   Interpreter No   Precautions   Precautions No   Rehab Discipline   Rehab Discipline PT   Visit   Visit number 9   POC Due date 06/29/13   Time Calculation   Start Time 0730   Stop Time 0800   Time Calculation (min) 30 min   Pain   Pain Score 0    Manual Therapy   Technique STM for R axilla area   Time in minutes 8   Ther Exercise   Exercise IR/ER 4lb pulley with towel   Reps 10   Sets 2   Ther Exercise 3   Exercise 3 Standing 2 lb I T Y   Reps 3 10   Sets 3 2   Ther Exercise 4   Exercise 4 High rows and pull downs weighted pulley 10 lb   Reps 4 10   Sets 4 2   Modalities   Type of modalities Hot pack   Hot pack   Joints Right;Shoulder   Position Prone   Time in minutes 10   Patient Education   What was taught? HEP   Method Verbal;Practice   Patient comprehension Yes

## 2013-06-05 ENCOUNTER — Ambulatory Visit (HOSPITAL_BASED_OUTPATIENT_CLINIC_OR_DEPARTMENT_OTHER): Payer: HMO | Admitting: Rehabilitative and Restorative Service Providers"

## 2013-06-05 DIAGNOSIS — M25511 Pain in right shoulder: Principal | ICD-10-CM

## 2013-06-05 DIAGNOSIS — M25512 Pain in left shoulder: Principal | ICD-10-CM

## 2013-06-05 NOTE — Progress Notes (Deleted)
06/05/13 0900   Language Information   Language of Care English   Interpreter No   Precautions   Precautions No   Rehab Discipline   Rehab Discipline PT   Visit   Visit number 10   POC Due date 06/29/13   Time Calculation   Start Time 0755   Stop Time 0830   Time Calculation (min) 35 min   Pain   Pain Score 0    Ther Exercise   Exercise IR/ ER with GTB   Reps 10   Sets 3   Ther Exercise 2   Exercise prone Y lifts with 1#   Reps 2 10   Sets 2 3   Ther Exercise 3   Exercise 3 horizontal abd with GTB   Reps 3 10   Sets 3 2   Ther Exercise 4   Exercise 4 pull downs with GTB   Reps 4 10   Sets 4 3   Patient Education   What was taught? HEP   Method Verbal;Practice;Written   Patient comprehension Yes

## 2013-06-05 NOTE — Progress Notes (Signed)
S: Pt reports she does get sore at times with doing the exercises, but no c/o pain at present.    O: Refer to Rehabilitation Treatment Flowsheet    A: Reviewed HEP with scapula strengthening and RTC strengthening and pt advised to perform either prone scapula ex or theraband ex as to not overdue scapula strengthening.  She was given written home program and will plan to progress stabilization ex     P: Continue as per Plan of Care

## 2013-06-05 NOTE — Progress Notes (Signed)
06/05/13 0900   Language Information   Language of Care English   Interpreter No   Precautions   Precautions No   Rehab Discipline   Rehab Discipline PT   Visit   Visit number 10   POC Due date 06/29/13   Time Calculation   Start Time 0755   Stop Time 0830   Time Calculation (min) 35 min   Pain   Pain Score 0    Ther Exercise   Exercise IR/ ER with GTB   Reps 10   Sets 3   Ther Exercise 2   Exercise prone Y lifts with 1#   Reps 2 10   Sets 2 3   Ther Exercise 3   Exercise 3 horizontal abd with GTB   Reps 3 10   Sets 3 2   Ther Exercise 4   Exercise 4 pull downs with GTB   Reps 4 10   Sets 4 3   Ther Exercise 5   Exercise 5 IR stretch   Ther Exercise 6   Exercise 6 pectoral stretch in doorway.   Patient Education   What was taught? HEP   Method Verbal;Practice;Written   Patient comprehension Yes

## 2013-06-11 ENCOUNTER — Ambulatory Visit (HOSPITAL_BASED_OUTPATIENT_CLINIC_OR_DEPARTMENT_OTHER): Payer: Self-pay | Admitting: Rehabilitative and Restorative Service Providers"

## 2013-06-13 ENCOUNTER — Telehealth (HOSPITAL_BASED_OUTPATIENT_CLINIC_OR_DEPARTMENT_OTHER): Payer: Self-pay

## 2013-06-13 DIAGNOSIS — E785 Hyperlipidemia, unspecified: Principal | ICD-10-CM

## 2013-06-13 DIAGNOSIS — R7309 Other abnormal glucose: Secondary | ICD-10-CM

## 2013-06-13 DIAGNOSIS — E559 Vitamin D deficiency, unspecified: Secondary | ICD-10-CM

## 2013-06-13 NOTE — Progress Notes (Signed)
Patient requesting lab orders be placed so she can come in to have them drawn prior to appt on 4/14, forwarded to provider.

## 2013-06-13 NOTE — Telephone Encounter (Signed)
Message copied by Arnette Schaumann on Wed Jun 13, 2013  4:54 PM  ------       Message from: Quincy Simmonds       Created: Wed Jun 13, 2013  4:39 PM       Regarding: blood work         Linda Rodriguez [id number], 63 year old, female, Telephone Information:       Home Phone      [phone]       Work Phone      Not on file.       Mobile          [phone]              Patient's Preferred Pharmacy:               [facility] - [address], [place]       Phone: [phone] Fax: [phone]              [id number] - [address], [address]       Phone: [phone] Fax: [phone]              CONFIRMED TODAY: Yes              Patient's language of care: English              Patient's PCP: Rhona Raider, MD              Person calling on behalf of patient: Patient (self)              Calls today Pt called stating she comes in every year for blood work. Wants orders placed so she can come in and get her tests done.                        ------

## 2013-06-14 NOTE — Progress Notes (Signed)
Future orders place.  Will ask Jeani Hawking to let her know.

## 2013-06-15 ENCOUNTER — Ambulatory Visit (HOSPITAL_BASED_OUTPATIENT_CLINIC_OR_DEPARTMENT_OTHER): Payer: HMO | Admitting: Rehabilitative and Restorative Service Providers"

## 2013-06-15 NOTE — Progress Notes (Signed)
Tried to call patient, no answer, left message on private voicemail that orders have been placed, call back with any questions.

## 2013-06-18 ENCOUNTER — Ambulatory Visit (HOSPITAL_BASED_OUTPATIENT_CLINIC_OR_DEPARTMENT_OTHER): Payer: HMO | Admitting: Rehabilitative and Restorative Service Providers"

## 2013-06-18 DIAGNOSIS — M25511 Pain in right shoulder: Principal | ICD-10-CM

## 2013-06-18 NOTE — Progress Notes (Signed)
06/18/13 0911   Language Information   Language of Care English   Interpreter No   Precautions   Precautions No   Rehab Discipline   Rehab Discipline PT   Visit   Visit number 11   POC Due date 06/29/13   Time Calculation   Start Time 0800   Stop Time 0830   Time Calculation (min) 30 min   Pain   Pain Score 0    Manual Therapy   Technique GHJ mobs for inferior glide   Time in minutes 5 min   Ther Exercise   Exercise ER with YTB   Reps 10   Sets 3   Ther Exercise 2   Exercise prone Y lifts    Reps 2 10   Sets 2 3   Ther Exercise 3   Exercise 3 prone T lifts   Reps 3 10   Sets 3 3   Ther Exercise 4   Exercise 4 ball roll on wall   Reps 4 10   Ther Exercise 5   Exercise 5 reviewed   Ther Exercise 6   Exercise 6 foam roller rolling on wall   Reps 6 10   Sets 6 1   Patient Education   What was taught? HEP   Method Verbal;Practice   Patient comprehension Yes

## 2013-06-18 NOTE — Progress Notes (Signed)
.  S: Pt reports she had exacerbation of pain last week after using GTB for horizontal Abd - pain level was 6-7 and she stopped exercises except for IR stretch.  Reports pain resolved by end of week and she began some exercises over weekend.   Pt also reports that she had discomfort along sternum, which has resolved.    O: Refer to Rehabilitation Treatment Flowsheet    A: Deferred theraband exercise and instead performed prone T and Y lifts on physioball as pt plans to purchase for HEP.  No c/o pain with exercise - does reports posterior cervical region feels tired with maintained prone on ball.  Reviewed precautions with ex program including good control for eccentric component with ER using YTB.   Also noted increased hypomobility for inferior glides at [facility] and she had good tolerance to mobilization    P: Continue as per Plan of Care

## 2013-06-25 ENCOUNTER — Ambulatory Visit (HOSPITAL_BASED_OUTPATIENT_CLINIC_OR_DEPARTMENT_OTHER): Payer: HMO | Admitting: Rehabilitative and Restorative Service Providers"

## 2013-06-25 DIAGNOSIS — M25511 Pain in right shoulder: Principal | ICD-10-CM

## 2013-06-25 NOTE — Progress Notes (Signed)
Outpatient Physical Therapy RE-EVALUATION   [facility]    Linda Rodriguez is a 63 year old female who presents to [place] with complaints of bilateral shoulder pain.   Reports initial onset of L shoulder pain starting @ 5 months ago after period of decreased use of L Arm ( had skin biopsy with sut  Then began developing Right shoulder pain.  She has now received 11 PT treatments to date and reports increased tolerance to activity and ROM, but still has pain with certain positions of her parm.    Patient reports symptoms increase with use of UE esp for IR and abd > 90. and decrease with rest arms at side.. Pain is located in bilateral shoulders and is described as sharp.     Patient learns best by Demonstration and Verbal.    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  Now with decreased tolerance     06/25/13 1200   Language Information   Language of Care English   Interpreter No   Rehab Discipline   Rehab Discipline PT   Visit   Visit number 12   POC Due date 05/25/13   Pain   Pain Score 3    Precautions   Precautions No   Posture   Forward Head Minimal   Scapular Winging Minimal   Posture assessment X   Cervical Assessment   Flexion 60   Extension 55   L Rotation full   R Rotation 65   L Side bend 40  (contralateral stretch)   R Side bend 35  (unilateral pain)   LUE PROM (degrees)   L Shoulder Flexion  0-170 170 Degrees   L Shoulder Extension  0-60 45 Degrees   L Shoulder ABduction 0-40 170 Degrees   L Shoulder Internal Rotation  0-70 80 Degrees   L Shoulder External Rotation  0-90 90 Degrees   LUE Strength   L Shoulder Flexion 5/5   L Shoulder Extension 5/5   L Shoulder ABduction 4/5   L  Shoulder Internal Rotation 4/5   L Shoulder External Rotation 4-/5   L Shoulder Horizontal ABduction 4/5   L Shoulder Horizontal ADduction 5/5   RUE PROM (degrees)   R Shoulder Flexion  0-170 170 Degrees   R Shoulder Extension 0-60 45 Degrees   R Shoulder ABduction 0-140 145 Degrees   R Shoulder Internal Rotation  0-70 90 Degrees   R Shoulder External Rotation  0-90 90 Degrees   RUE Strength   R Shoulder Flexion 4+/5   R Shoulder Extension 4/5   R Shoulder ABduction 4/5   R Shoulder Internal Rotation 4/5   R Shoulder External Rotation 4/5   Left Shoulder results   L O'Brien's Negative   L Speed's Negative   L Lift Off Negative   L Apprehension Negative   Right Shoulder results   R Shoulder O'Brien's Negative   R Speed's Negative   R Lift Off  Negative   R Apprehension Negative   Palpation   Increased Tissue Density upper trap R > L   Right Shoulder Glenohumeral   R Glenohumeral Anterior 3/6  R Glenohumeral Posterior 2/6   R Glenohumeral inferior 2/6   Left Shoulder Glenohumeral   L Glenohumeral Anterior 3/6   L Glenohumeral Posterior 2/6   L Glenohumeral inferior 2/6   Patient Education   What was taught? PT POC / HEP   Method Verbal   Patient comprehension Yes             Physical Therapy Plan of Care    YB:OFBPZ Linda Brawn, MD  Referring Provider: Vertell Limber, MD    Diagnosis: Pain in joint, shoulder region    Assessment/Objective Findings:   Patient is a 63 year old female with complains of pain in her bilateral Shoulder.     Pt reports onset of pain starting @ 5 months ago with initial pain in L  Shoulder after period of restricted movement and then developed Right shoulder pain. . 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 Pt expresses long term goal of returning to baseline, is motivated to work towards this in PT.  She has received11 PT treatments to date and reports overall improvement with activity, but still  with certain movements causing pain esp extension /, abd and IR of Right upper extremity.      Pt reports increased tolerance and she does demonstrate increase in ROM and improving strength, but still with some scapula and RTC weakness noted.   Pt will benefit from continued skilled PT focused on strengthening and stabilization exercise  to address the following problems and impairments noted upon evaluation: Pain, Decreased Strength, Decreased Functional Mobility, Decreased Joint Mobility and Decreased Tolerance of ADLs.     These problems limit the patient with the following functional activities: regular exercise program and use of UE's esp with IR/ ext . The prescribed treatment plan of care is medically necessary.        Medicare Related Problems:    Short Term Functional Goals: 2 weeks   Pt will be independent with HEP  MET  Pt will have decrease in upper trap mm guarding  Progressing      Long Term Goal: 4 weeks  Pt will report decrease in pain level to 2/10 on pain scale  Pt will demonstrate norma shoulder mechanics with good scapula stability  Pt will be independent with HEP for strengthening and stabilization program  Pt will return to PLOF    Treatment Plan:  ** Stretching/ROM Exercise  ** Therapeutic Exercise  ** Home Exercise Program  ** Joint Mobilization  ** Soft Tissue Mobilization  ** Hot/Cold Rx  ** Functional Activities  ** Patient Education  ** Taping    Recommend Physical Therapy be continued 2 times per week for 4 weeks.  The rehabilitation potential for this patient is excellent    [name] is aware of attendance policy: Yes  Plan of care discussed with Patient/Family: Yes  Patient goals reviewed and incorporated in plan of care: Yes  Patient/Family agrees with plan of care: Yes  Patient/Family education: Yes  Does patient feel safe at home: Yes      Linda Rodriguez PT  06/25/13

## 2013-06-25 NOTE — Progress Notes (Signed)
I certify that the documented Treatment Plan is reasonable and necessary.    06/25/2013  Vertell Limber, MD

## 2013-06-27 ENCOUNTER — Telehealth (HOSPITAL_BASED_OUTPATIENT_CLINIC_OR_DEPARTMENT_OTHER): Payer: Self-pay

## 2013-06-27 ENCOUNTER — Ambulatory Visit (HOSPITAL_BASED_OUTPATIENT_CLINIC_OR_DEPARTMENT_OTHER): Payer: HMO

## 2013-06-27 DIAGNOSIS — E559 Vitamin D deficiency, unspecified: Secondary | ICD-10-CM

## 2013-06-27 DIAGNOSIS — R7309 Other abnormal glucose: Principal | ICD-10-CM

## 2013-06-27 DIAGNOSIS — E785 Hyperlipidemia, unspecified: Secondary | ICD-10-CM

## 2013-06-27 LAB — LIPID PANEL
Cholesterol: 180 mg/dL (ref 0–239)
HIGH DENSITY LIPOPROTEIN: 70 mg/dL (ref 40–?)
LOW DENSITY LIPOPROTEIN DIRECT: 96 mg/dL (ref 0–189)
TRIGLYCERIDES: 148 mg/dL (ref 0–150)

## 2013-06-27 LAB — VITAMIN D,25 HYDROXY: VITAMIN D,25 HYDROXY: 31 ng/mL (ref 30.0–100.0)

## 2013-06-27 NOTE — Telephone Encounter (Signed)
Message copied by Arnette Schaumann on Wed Jun 27, 2013  1:21 PM  ------       Message from: Bunnie Philips       Created: Wed Jun 27, 2013 11:00 AM       Regarding: med question       Contact: [phone]                       Linda Rodriguez [id number], 63 year old, female, Telephone Information:       Home Phone      [phone]       Work Phone      Not on file.       Mobile          [phone]                     CONFIRMED TODAY: Yes              CALL BACK NUMBER: [phone]              Patient's language of care: English              Patient does not need an interpreter.              Patient's PCP: Rhona Raider, MD              Person calling on behalf of patient: Patient (self)              Calls today pt has med question please call her back.                ------

## 2013-06-27 NOTE — Progress Notes (Signed)
.  1 SST   .[place], [place], 06/27/2013, 8:21 AM

## 2013-06-27 NOTE — Progress Notes (Signed)
Called patient, she reports she has been having a dental problem, reports she has appt today but has also had a cough. Reports she was exposed to a child with cold/cough on Sunday, she developed symptoms this week. Reports it is a dry cough, feels that it is related to post nasal drip. Denies fever, denies SOB. Wondering what she can take so she won't cough at dental visit. Suggested an OTC decongestant such as sudafed, perhaps if post nasal drip is controlled cough will be better. Also advised OTC cough med such as delsym-just be sure to read ingredients so she is not doubling same ingredient. Patient verbalized understanding. Also advised she call back with any new symptoms or if cough persists, she reports she has appt with pcp next week.

## 2013-06-28 LAB — HEMOGLOBIN A1C
ESTIMATED AVERAGE GLUCOSE: 103 (ref 74–160)
HEMOGLOBIN A1C: 5.2 % (ref 4.0–5.6)

## 2013-06-29 ENCOUNTER — Ambulatory Visit (HOSPITAL_BASED_OUTPATIENT_CLINIC_OR_DEPARTMENT_OTHER): Payer: HMO | Admitting: Rehabilitative and Restorative Service Providers"

## 2013-06-29 DIAGNOSIS — M25511 Pain in right shoulder: Principal | ICD-10-CM

## 2013-06-29 NOTE — Progress Notes (Signed)
Quick Note:    Dear Linda Rodriguez are your recent blood test results - which are all good.    Your blood sugars have come down - and you are no longer in the borderline diabetes category. Congratulations. Keep doing what you are doing.    Your cholesterol levels remain good on atorvastatin. Please continue.    Your vitamin D level is in the low normal level. Please continue taking vitamin D 2000 units daily - to keep the level in the normal range.    Please call the clinic if you have any questions.    Sincerely,      Rhona Raider, MD    ______

## 2013-06-29 NOTE — Progress Notes (Signed)
06/29/13 0900   Language Information   Language of Care English   Interpreter No   Precautions   Precautions No   Rehab Discipline   Rehab Discipline PT   Visit   Visit number 52   POC Due date 05/25/13   Time Calculation   Start Time 0730   Stop Time 0800   Time Calculation (min) 30 min   Pain   Pain Score 3    Manual Therapy   Technique GHJ mobs for inferior glide   Time in minutes 5 min   Ther Exercise   Exercise ER/IR 4 lb   Reps 10   Sets 2   Ther Exercise 2   Exercise Prone I T Y over ball   Reps 2 10   Sets 2 2   Ther Exercise 3   Exercise 3 4 way RTB isometric wals for B shoulders   Reps 3 5   Sets 3 2   Ther Exercise 4   Exercise 4 reviewed   Patient Education   What was taught? HEP   Method Verbal;Demo;Written   Patient comprehension Yes

## 2013-06-29 NOTE — Progress Notes (Signed)
S: 3/10  O: Refer to Rehabilitation Treatment Flowsheet  A: Pt progressing well.  Still restricted into posterior glides B restricting B IR  P: Continue per plan of care.

## 2013-07-02 ENCOUNTER — Ambulatory Visit (HOSPITAL_BASED_OUTPATIENT_CLINIC_OR_DEPARTMENT_OTHER): Payer: HMO | Admitting: Rehabilitative and Restorative Service Providers"

## 2013-07-02 DIAGNOSIS — M25511 Pain in right shoulder: Secondary | ICD-10-CM

## 2013-07-02 DIAGNOSIS — M25512 Pain in left shoulder: Principal | ICD-10-CM

## 2013-07-02 NOTE — Progress Notes (Signed)
S: 2/10  O: Refer to Rehabilitation Treatment Flowsheet  A: Pt continues to progress well and has been compliant with HEP.  More pan in L shoulder today  P: Continue per plan of care.

## 2013-07-02 NOTE — Progress Notes (Signed)
07/02/13 4458   Language Information   Language of Care English   Interpreter No   Precautions   Precautions No   Rehab Discipline   Rehab Discipline PT   Visit   Visit number 14   POC Due date 05/25/13   Time Calculation   Start Time 0730   Stop Time 0800   Time Calculation (min) 30 min   Pain   Pain Score 2    Manual Therapy   Technique GHJ mobs for inferior glide   Time in minutes 5 min   Ther Exercise   Exercise ER/IR 4 lb   Reps 10   Sets 2   Ther Exercise 2   Exercise Prone I T Y over ball   Reps 2 10   Sets 2 2   Ther Exercise 3   Exercise 3 4 way RTB isometric wals for B shoulders   Reps 3 5   Sets 3 2   Ther Exercise 4   Exercise 4 supine hooklying pull downs and D2 flexion with sprt cord B   Reps 4 10   Sets 4 1   Patient Education   What was taught? HEP   Method Verbal;Demo;Written   Patient comprehension Yes

## 2013-07-03 ENCOUNTER — Ambulatory Visit (HOSPITAL_BASED_OUTPATIENT_CLINIC_OR_DEPARTMENT_OTHER): Payer: HMO | Admitting: Emergency Medicine

## 2013-07-03 ENCOUNTER — Encounter (HOSPITAL_BASED_OUTPATIENT_CLINIC_OR_DEPARTMENT_OTHER): Payer: Self-pay | Admitting: Emergency Medicine

## 2013-07-03 VITALS — BP 134/74 | HR 93 | Temp 98.0°F | Ht 61.0 in | Wt 113.0 lb

## 2013-07-03 DIAGNOSIS — E785 Hyperlipidemia, unspecified: Secondary | ICD-10-CM

## 2013-07-03 DIAGNOSIS — E559 Vitamin D deficiency, unspecified: Secondary | ICD-10-CM

## 2013-07-03 DIAGNOSIS — R7309 Other abnormal glucose: Secondary | ICD-10-CM

## 2013-07-03 DIAGNOSIS — Z0000 Encounter for general adult medical examination without abnormal findings: Principal | ICD-10-CM

## 2013-07-03 NOTE — Progress Notes (Signed)
Linda Rodriguez is a 63 year old female  CPE  Pap not due - defer to 2017  Recent labs printed and reviewed.    Right shoulder pain - improved - doing physical therapy -   Never started nabumetone (written by Dr Bjorn Loser.  Progress is very slow.    Hyperlipidemia - results much improved on atorvastatin.    LDL DIRECT (mg/dL)   Date  Value    06/27/2013  96   On atorvastatin     04/05/2012  143*    01/18/2011  96    ----------    Elevated A1c - no longer in borderline DM category - congratulated on her success.    HEMOGLOBIN A1C (%)   Date  Value    06/27/2013  5.2     04/05/2012  5.9*    01/18/2011  5.9*   ----------    Vitamin d deficiency - low normal - explained how this happens.    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 Lorelle Formosa "ECC inadequate but not suspicious clinically for abnormality; follow one year pap as planned." 3/09 Pap insufficient cells, HPV negative 9/09 Pap normal HPV positive repeat colpo after topical estrogens to facilitate ECC normal Pap 11/2008 WNL HPV negative, return to routine screening 03/2010@ patient request HPV negative, Pap no transformation zone, no rep    Elevated blood pressure reading without diagnosis of hypertension 05/17/2011    Comment: Most Recent BP Reading(s) 04/05/12 : 120/68 08/20/11 : 120/60 08/18/11 : 103/57    Patient reports February BP readings 110/54, 114/70 and states that since her life has become less stressful, her BP decreased.   Most Recent BP Reading(s) 01/27/11 : 148/72 04/08/10 : 118/60 05/02/09 : 112/64 11/20/08 : 110/68  10/30/08 : 112/60           Past Surgical History    NIPPLE/AREOLA RECONSTRUCTION  1995    Comment breast reduction    BREAST REDUCTION SURGERY         Family History    Heart Father     Comment: MI age 56    Osteoporosis Mother     Asthma Sister     Asthma Brother     Diabetes Brother     Theatre stage manager Abuse Brother     Cataracts Father     Hypertension Sister     Hypertension Brother     Diabetes Brother     Cancer - Breast FamHxNeg     Cancer - Colon Sister      Social History    Marital Status: Single              Spouse Name:                       Years of Education:  Number of children:               Occupational History  Occupation          Tax inspector          NONE                    Social History Main Topics    Smoking Status: Never Smoker                      Smokeless Status: Never Used                        Alcohol Use: Yes           0.0 oz/week       Comment: less than weekly    Drug Use: No              Sexual Activity: Not Currently     Partners with: Female    Other Topics            Concern  Military Service        No  Blood Transfusions      No  Caffeine Concern        No  Occupational Exposure   No  Sleep Concern           No  Stress Concern          No  Weight Concern          No  Special Diet            No  Exercise                Yes    Comment:stopped zumba due to hip pain, plans to             restart  Seat Belt               Yes  Self-Exams              Yes    Social History Narrative    Pt  Is single and works as an Astronomer for [employer]: Negative for fever, chills and weight loss.   HENT: Negative for ear pain and sore throat.    Respiratory: Negative for cough and shortness of breath.    Cardiovascular: Negative for chest pain and palpitations.   Gastrointestinal: Positive for nausea (while on amox for dental work.). Negative for vomiting and abdominal pain.   Genitourinary: Negative for  dysuria and hematuria.   Musculoskeletal: Positive for joint pain (right shoulder is tight.  doing PT).   Neurological: Negative for seizures, loss of consciousness and headaches.   Endo/Heme/Allergies: Positive for environmental allergies (pollen, post nasal drip.). Does not bruise/bleed easily.   Psychiatric/Behavioral: Negative for depression.     Physical Exam   Nursing note and vitals reviewed.  Constitutional: She is oriented to person, place, and time. She appears well-developed and well-nourished. No distress.   Estimated body mass index is 21.36 kg/(m^2) as calculated from the following:    Height as of this encounter: 5\' 1"  (1.549 m).    Weight as of this  encounter: 113 lb (51.256 kg).     HENT:   Head: Normocephalic.   Right Ear: External ear normal.   Left Ear: External ear normal.   Mouth/Throat: Oropharynx is clear and moist. No oropharyngeal exudate.   Eyes: EOM are normal. Pupils are equal, round, and reactive to light.   Neck: Neck supple. No thyromegaly present.   Cardiovascular: Normal rate, regular rhythm and intact distal pulses.    No murmur heard.  Pulmonary/Chest: Effort normal and breath sounds normal.   Breast exam - no breast masses, discharge, tenderness.  Axillae clear.  Healed surgical scars   Abdominal: Soft. Bowel sounds are normal. There is no tenderness. There is no rebound.   Musculoskeletal: She exhibits no edema.   Lymphadenopathy:     She has no cervical adenopathy.   Neurological: She is alert and oriented to person, place, and time.   Skin: Skin is warm and dry.   Psychiatric: She has a normal mood and affect. Her behavior is normal.     ASSESSMENT/PLAN:  (V70.0) Routine general medical examination at a health care facility    (790.6) Elevated hemoglobin A1c  Comment: A1c much lower  Plan: encouraged to continue her efforts    (272.4) HYPERLIPIDEMIA NEC/NOS  Comment: better on atorvastatin  Plan: continue med    (268.9) Vitamin D deficiency  Comment: low normal  Plan: needs to  continue daily supplement.

## 2013-07-11 ENCOUNTER — Ambulatory Visit (HOSPITAL_BASED_OUTPATIENT_CLINIC_OR_DEPARTMENT_OTHER): Payer: HMO | Admitting: Rehabilitative and Restorative Service Providers"

## 2013-07-11 DIAGNOSIS — M25511 Pain in right shoulder: Principal | ICD-10-CM

## 2013-07-11 NOTE — Progress Notes (Signed)
07/11/13 0840   Language Information   Language of Care English   Interpreter No   Precautions   Precautions No   Rehab Discipline   Rehab Discipline PT   Visit   Visit number 15   Time Calculation   Start Time 0730   Stop Time 0800   Time Calculation (min) 30 min   Pain   Pain Score 0    Manual Therapy   Technique GHJ mobs for inferior glide   Time in minutes 5 min   Ther Exercise   Exercise ER/IR RTB   Reps 10   Sets 1   Ther Exercise 2   Exercise Prone I T Y over ball 2 lb   Reps 2 10   Sets 2 1   Ther Exercise 4   Exercise 4 supine hooklying pull downs and D2 flexion with sprt cord B   Reps 4 10   Sets 4 1   Ther Exercise 5   Exercise 5 supine stretch over PB   Patient Education   What was taught? HEP   Method Verbal;Demo;Written   Patient comprehension Yes

## 2013-07-11 NOTE — Progress Notes (Signed)
S: 0/10  O: Refer to Rehabilitation Treatment Flowsheet  A: Pt progressing nicely.  Close to D/C  P: Continue per plan of care.

## 2013-07-13 ENCOUNTER — Ambulatory Visit (HOSPITAL_BASED_OUTPATIENT_CLINIC_OR_DEPARTMENT_OTHER): Payer: HMO | Admitting: Rehabilitative and Restorative Service Providers"

## 2013-07-16 ENCOUNTER — Ambulatory Visit (HOSPITAL_BASED_OUTPATIENT_CLINIC_OR_DEPARTMENT_OTHER): Payer: HMO | Admitting: Rehabilitative and Restorative Service Providers"

## 2013-07-16 DIAGNOSIS — M25511 Pain in right shoulder: Principal | ICD-10-CM

## 2013-07-16 NOTE — Progress Notes (Signed)
07/16/13 0835   Language Information   Language of Care English   Interpreter No   Precautions   Precautions No   Rehab Discipline   Rehab Discipline PT   Visit   Visit number 16   Time Calculation   Start Time 0730   Stop Time 0800   Time Calculation (min) 30 min   Pain   Pain Score 0    Manual Therapy   Technique Graston IR technique B   Time in minutes 2 min  (Completed by Gypsy Decant PT)   Ther Exercise   Exercise ER/IR 4lb pulley    Reps 10   Sets 2   Ther Exercise 2   Exercise Prone I T Y over ball 2 lb   Reps 2 10   Sets 2 3   Ther Exercise 3   Exercise 3 4 way RTB isometric wals for B shoulders   Reps 3 5   Sets 3 2   Patient Education   What was taught? HEP   Method Verbal;Demo;Written   Patient comprehension Yes

## 2013-07-25 NOTE — Addendum Note (Signed)
Addended by: Lanell Matar on: 07/25/2013 07:15 AM     Modules accepted: Orders

## 2013-07-27 ENCOUNTER — Ambulatory Visit (HOSPITAL_BASED_OUTPATIENT_CLINIC_OR_DEPARTMENT_OTHER): Payer: HMO | Admitting: Rehabilitative and Restorative Service Providers"

## 2013-07-27 DIAGNOSIS — M25511 Pain in right shoulder: Principal | ICD-10-CM

## 2013-07-27 NOTE — Progress Notes (Signed)
PHYSICAL THERAPY  DISCHARGE REPORT    DIAGNOSIS:   MD: Vertell Limber  [address]  [place], [place] [address]    Your patient, Linda Rodriguez, has been discharged from Physical Therapy after a total of 17 visits.    REASON(S) FOR DISCHARGE:    This patient has: met all goals and is back to I ADL's    RECOMMENDATIONS:    This patient should: continue stretching and strengthening program to B shoulders    COMMENTS:    If you have any questions please feel free to contact the department at Dept: [phone].    Therapist: Lanell Matar MSPT  07/27/13

## 2013-08-09 ENCOUNTER — Telehealth (HOSPITAL_BASED_OUTPATIENT_CLINIC_OR_DEPARTMENT_OTHER): Payer: Self-pay | Admitting: Registered Nurse

## 2013-08-09 ENCOUNTER — Encounter (HOSPITAL_BASED_OUTPATIENT_CLINIC_OR_DEPARTMENT_OTHER): Payer: Self-pay | Admitting: Emergency Medicine

## 2013-08-09 NOTE — Progress Notes (Signed)
Left VM for pt to contact [facility] and to call back office further questions. SO

## 2013-08-09 NOTE — Progress Notes (Addendum)
Pt requesting to have yellow fever for trip to [place]. Advised her to call [facility], she agrees. SO

## 2013-08-09 NOTE — Telephone Encounter (Signed)
-----   Message from Silverio Decamp sent at 08/09/2013  8:43 AM EDT -----  Regarding: traveling ? vaccine  KYLI SORTER [id number], 62 year old, female, Telephone Information:  Home Phone      [phone]    Rod Can NUMBER: [phone]    Patient's language of care: English    Patient does not need an interpreter.    Patient's PCP: Rhona Raider, MD    Person calling on behalf of patient: Patient (self)    Calls today traveling to [place].  Wants to know if she can have a certain vaccine.

## 2013-08-09 NOTE — Telephone Encounter (Signed)
replied to patient via my chart

## 2013-08-16 ENCOUNTER — Telehealth (HOSPITAL_BASED_OUTPATIENT_CLINIC_OR_DEPARTMENT_OTHER): Payer: Self-pay

## 2013-08-16 ENCOUNTER — Ambulatory Visit (HOSPITAL_BASED_OUTPATIENT_CLINIC_OR_DEPARTMENT_OTHER): Payer: HMO | Admitting: Licensed Practical Nurse

## 2013-08-16 DIAGNOSIS — Z23 Encounter for immunization: Principal | ICD-10-CM

## 2013-08-16 NOTE — Progress Notes (Signed)
Called patient, she reports she needs Hep A vaccine for travel to [place] next month, requests appt this evening, appt scheduled. I asked about new pcp, she selected dr Sara Chu, she will notify her insurance.

## 2013-08-16 NOTE — Progress Notes (Signed)
08/16/2013  VIS given prior to administration and reviewed with the patient and or legal guardian. Patient understands the disease and the vaccine. See immunization/Injection module or chart review for date of publication and additional information.  Adele Barthel. Jonn Chaikin, LPN    Per orders of Dr. Sara Chu, injection of Hep A given by Adele Barthel. Obadiah Dennard, LPN. Patient instructed to remain in clinic for 20 minutes afterwards, and to report any adverse reaction to me immediately.

## 2013-08-16 NOTE — Telephone Encounter (Signed)
-----   Message from [name] sent at 08/16/2013 10:40 AM EDT -----  Regarding: traveling to [place] needs Hep A   09-10-2013      Linda Rodriguez [id number], 63 year old, female, Telephone Information:  Home Phone      [phone]  Work Phone      Not on file.  Mobile          [phone]      Patient's Preferred Pharmacy:     [facility] - [address], [place]  Phone: [phone] Fax: [phone]    [id number] - [address], [address]  Phone: [phone] Fax: [phone]          CALL BACK NUMBER:  [phone]    Best time to call back:   Cell phone:   Other phone:    Available times:    Patient's language of care: English      Patient's PCP: Rhona Raider, MD    Person calling on behalf of patient:    traveling to [place] needs Hep A   09-10-2013    Calls today with questions and concerns.  Vaccine to travel to [place]

## 2013-08-20 ENCOUNTER — Ambulatory Visit (HOSPITAL_BASED_OUTPATIENT_CLINIC_OR_DEPARTMENT_OTHER): Payer: HMO | Admitting: Licensed Practical Nurse

## 2013-08-20 DIAGNOSIS — Z23 Encounter for immunization: Principal | ICD-10-CM

## 2013-08-20 NOTE — Progress Notes (Signed)
08/20/2013  VIS given prior to administration and reviewed with the patient and or legal guardian. Patient understands the disease and the vaccine. See immunization/Injection module or chart review for date of publication and additional information.  Adele Barthel. Christyann Manolis, LPN    Per orders of Dr. Sara Chu, injection of Typhoid given by Adele Barthel. Cleopha Indelicato, LPN. Patient instructed to remain in clinic for 20 minutes afterwards, and to report any adverse reaction to me immediately.

## 2013-08-21 ENCOUNTER — Encounter (HOSPITAL_BASED_OUTPATIENT_CLINIC_OR_DEPARTMENT_OTHER): Payer: Self-pay | Admitting: Internal Medicine

## 2013-09-03 ENCOUNTER — Telehealth (HOSPITAL_BASED_OUTPATIENT_CLINIC_OR_DEPARTMENT_OTHER): Payer: Self-pay | Admitting: Registered Nurse

## 2013-09-03 NOTE — Progress Notes (Addendum)
Returned call to pt  Left voicemail on personal voicemail, forms will be at front desk for pick up   Immunization form, titers and physical form placed at front desk for pick up

## 2013-09-03 NOTE — Telephone Encounter (Signed)
-----   Message from Bunnie Philips sent at 09/03/2013  9:54 AM EDT -----  Regarding: will come pick up vaccine records for tomorrow   Contact: [phone]      Linda Rodriguez [id number], 63 year old, female, Telephone Information:  Home Phone      [phone]  Work Phone      Not on file.  Mobile          [phone]    CONFIRMED TODAY: Yes    CALL BACK NUMBER: [phone]    Patient's language of care: English    Patient does not need an interpreter.    Patient's PCP: Alinda Deem, MD    Person calling on behalf of patient: Patient (self)    Calls today :per pt request. " I would like to obtain a list of all vaccines I have gotten so far including  Hep A (taken on 08/16/2013) and Typhoid (taken on 08/20/2013); as well TITERS. I will be travelling to a few countries in [place] and would like to bring the proof with me just in case they ask for it" Would like to pick it up tomorrow afternoon. Thanks.

## 2013-09-10 ENCOUNTER — Telehealth (HOSPITAL_BASED_OUTPATIENT_CLINIC_OR_DEPARTMENT_OTHER): Payer: Self-pay | Admitting: Internal Medicine

## 2013-09-10 ENCOUNTER — Ambulatory Visit (HOSPITAL_BASED_OUTPATIENT_CLINIC_OR_DEPARTMENT_OTHER): Payer: HMO | Admitting: Physician Assistant

## 2013-09-10 VITALS — BP 140/90 | HR 78 | Temp 98.6°F | Wt 113.0 lb

## 2013-09-10 DIAGNOSIS — R21 Rash and other nonspecific skin eruption: Principal | ICD-10-CM

## 2013-09-10 MED ORDER — CLOTRIMAZOLE 1 % EX SOLN
Freq: Two times a day (BID) | CUTANEOUS | Status: AC
Start: 2013-09-10 — End: 2013-10-10

## 2013-09-10 MED ORDER — HYDROCORTISONE 2.5 % EX CREA
TOPICAL_CREAM | Freq: Two times a day (BID) | CUTANEOUS | Status: AC
Start: 2013-09-10 — End: 2013-11-10

## 2013-09-10 NOTE — Progress Notes (Signed)
Linda Rodriguez is a 63 year old female patient of Alinda Deem, MD presenting with:       Rash x 4 days  On back  Started after scratching back with a foot scrubber/callus remover.   Rash is itchy   Put Neeva lotion on it and feels better   No burning pain.   Had shingles vaccine about 2 years ago   Going on vacation today - [place] for 3 weeks       Current Outpatient Prescriptions on File Prior to Visit:  nabumetone (RELAFEN) 500 MG tablet Take 1 tablet by mouth 2 (two) times daily. Take one tablet every 12 hours Disp: 60 tablet Rfl: 2   EPINEPHrine (EPIPEN 2-PAK) 0.3 MG/0.3ML Injection Device Inject 0.3 mg into the muscle once as needed. Disp: 2 each Rfl: 1   Calcium Carbonate-Vitamin D (CALCIUM 600-D) 600-400 MG-UNIT TABS Tablet Take 1 tablet by mouth daily. for prevention of osteoporosis Disp: 60 tablet Rfl: 12   Cholecalciferol (VITAMIN D) 2000 UNIT CAPS Take 1 capsule by mouth daily. Disp: 1 capsule Rfl: 0   PX ENTERIC ASPIRIN 81 MG OR TBEC 1 tablet by mouth daily Disp:  Rfl: 12   ATORVASTATIN CALCIUM 10 MG OR TABS one every other day Disp:  Rfl: 0   OTHER MEDICATION ciprofibrate 100 mg from [place] (a fibric acid)  one every other day Disp:  Rfl: 0     No current facility-administered medications on file prior to visit.       Review of Systems   Constitutional: Negative for fever and chills.   Skin: Positive for itching and rash.     OBJECTIVE  BP 140/90   Pulse 78   Temp(Src) 98.6 F (37 C) (Temporal)   Wt 113 lb (51.256 kg)   SpO2 98%    Physical Exam   Constitutional: She is oriented to person, place, and time. No distress.   Neurological: She is oriented to person, place, and time.   Skin:              ASSESSMENT/PLAN:    (782.1) Rash  (primary encounter diagnosis)  Comment: Rash appears to be dry skin dermatitis worsen by excoriation from aggressive scratching. i do not think this is fungal or shingles.   Plan:Since that patient is going away for 3 weeks will prescribe 2 medications  Start  using Hydrocortisone cream first  If getting worse with switch to clotimazlor   Avoid scratching    hydrocortisone 2.5 % cream, clotrimazole         (LOTRIMIN AF) 1 % external solution        1. The patient indicates understanding of these issues and agrees with the plan.  2.  The patient is given an After Visit Summary sheet that lists all of their medications with directions, their allergies, orders placed during this encounter, immunization dates, and follow- up instructions.  3. I reviewed the patient's medical information and medical history   4.  I reconciled the patient's medication list and prepared and supplied needed refills.  5.  I have reviewed the past medical, family, and social history sections including the medications and allergies listed in the above medical record    Electronically signed by: Celesta Gentile, PA-C, 09/10/2013, 2:11 PM    This note is electronically signed in the electronic medical record.

## 2013-09-10 NOTE — Progress Notes (Signed)
Delayed documentaiton as on call provider  Paged and spoked with pt Saturday night at 10 pm 09/08/13  Pt had itchy skin on Tuesday in shower, used something she uses on her feet to scratch it  Got progressively worse of the week  Leaving Monday (today) for [place] at 2pm  Doesn't think it is bad enough to go to ED  Didn't know about Saturday clinic  Feels well otherwise  Encouraged to go to ED if fevers, worsening, spreading  Otherwise call first thing Monday morning to see if she can get an appt

## 2013-12-27 ENCOUNTER — Telehealth (HOSPITAL_BASED_OUTPATIENT_CLINIC_OR_DEPARTMENT_OTHER): Payer: Self-pay

## 2013-12-27 NOTE — Telephone Encounter (Signed)
-----   Message from Oscar La sent at 12/27/2013  9:24 AM EDT -----  Regarding: flu shot update  Contact: [phone]    Call back number [phone]  CONFIRMED TODAY: Yes    Patient's language of care: English    Patient does not need an interpreter.    Patient's PCP: Alinda Deem, MD (General)    Person calling on behalf of patient: Patient (self)    Calls to requesting the nurse to update her chart because she got the flu shot yesterday

## 2013-12-27 NOTE — Progress Notes (Signed)
Immunizations updated.

## 2014-03-29 ENCOUNTER — Ambulatory Visit (HOSPITAL_BASED_OUTPATIENT_CLINIC_OR_DEPARTMENT_OTHER): Payer: HMO | Admitting: Ophthalmology

## 2014-03-29 DIAGNOSIS — H538 Other visual disturbances: Secondary | ICD-10-CM

## 2014-03-29 DIAGNOSIS — H04129 Dry eye syndrome of unspecified lacrimal gland: Secondary | ICD-10-CM

## 2014-03-29 DIAGNOSIS — H04123 Dry eye syndrome of bilateral lacrimal glands: Principal | ICD-10-CM

## 2014-03-29 NOTE — Nursing Note (Signed)
Annual eye exam, f/u Presbyopia, Hordeolum.  Vision is blurry cc.

## 2014-03-29 NOTE — Patient Instructions (Signed)
Drema Halon      Use over-the-counter artificial tears such as Refresh, Systane, Theratears, Soothe, or Visine Tears 4 X per day or as needed for symptoms of dry eye.

## 2014-03-29 NOTE — Progress Notes (Signed)
.  Linda Rodriguez was seen at the [facility].    Linda Rodriguez was c.o. Decreased vision OD then OS.    Linda Rodriguez had  Dry eye sensation OD.    On exam Linda Rodriguez had punctate epithelial erosions OU.    Linda Rodriguez did not have any other gross ocular pathology to explain her decreased vision.    Linda Rodriguez will use artificial tears 4 times per day.    Linda Rodriguez will rtc in 7-10 to reevaluate her vision.

## 2014-04-17 ENCOUNTER — Encounter (HOSPITAL_BASED_OUTPATIENT_CLINIC_OR_DEPARTMENT_OTHER): Payer: Self-pay | Admitting: Ophthalmology

## 2014-04-17 ENCOUNTER — Ambulatory Visit (HOSPITAL_BASED_OUTPATIENT_CLINIC_OR_DEPARTMENT_OTHER): Payer: HMO | Admitting: Ophthalmology

## 2014-04-17 DIAGNOSIS — H524 Presbyopia: Secondary | ICD-10-CM

## 2014-04-17 DIAGNOSIS — H04129 Dry eye syndrome of unspecified lacrimal gland: Secondary | ICD-10-CM

## 2014-04-17 DIAGNOSIS — H5200 Hypermetropia, unspecified eye: Secondary | ICD-10-CM

## 2014-04-17 DIAGNOSIS — H04123 Dry eye syndrome of bilateral lacrimal glands: Principal | ICD-10-CM

## 2014-04-17 DIAGNOSIS — H5203 Hypermetropia, bilateral: Secondary | ICD-10-CM

## 2014-04-17 NOTE — Nursing Note (Signed)
64 years old female,    Here for a follow up on punctate epithelial erosions OU.    Reports improved vision in OS since last visit.  + Foreign body sensation OS off and on. Using artificial tears.  Denies flashes or floaters.    Ocular history  Dry eye OU  Punctate epithelial erosions

## 2014-04-17 NOTE — Progress Notes (Signed)
Linda Rodriguez was seen for follow up of dry eye and keratoconjunctivitis sicca.    She stated that after using artificial tears her eyes now feel better.    The punctate epithelial erosions had resolved.    She was advised to continue to use artificial tears qid on a regular basis.    She was advised to rtc prn/1 year.

## 2014-04-23 NOTE — Nursing Note (Signed)
See tech note.

## 2014-05-03 ENCOUNTER — Telehealth (HOSPITAL_BASED_OUTPATIENT_CLINIC_OR_DEPARTMENT_OTHER): Payer: Self-pay

## 2014-05-03 ENCOUNTER — Telehealth (HOSPITAL_BASED_OUTPATIENT_CLINIC_OR_DEPARTMENT_OTHER): Payer: Self-pay | Admitting: Internal Medicine

## 2014-05-03 NOTE — Progress Notes (Unsigned)
.  Appointment scheduled [id number]    Specialty Location: Other: [address],   [place], [place]     Specialist's name: Dr Berton Mount Specialist's NPI#: [id number]   Specialty Phone Number: [phone] Specialty Fax Number: [phone]    Reason for appointment: routine skin check    Day of appt: Monday   Date of appt: 06/06/2014  Time of appt: 8.15 am   Patient Notification: Phone call - Spoke with patient     Patient has an established relationship with this provider. Has HX of skin cancer, basal cell. Offered services at [facility]. Patient declines.    Please also place order for Mammo. She [facility].     This is a new patient of yours. Used to be Dr Vicki Mallet pt. Patient Is scheduled to see you 07/10/2014.

## 2014-05-03 NOTE — Progress Notes (Signed)
Night/Weekend Call:    Message: "bad cough. There are times when can't catch breath. Running nose and itching on back of throat"  Page time: 19:34  Call back: 19:36    Spoke with patient - cough since Wednesday - hard to catch breath at times, hard to sleep at night. More talks - more cough. No SOB at present time, only with cough.  No CP, fever. Some rhinorrhea and tickle in throat, no swelling or dysphagia. Taking benzonatate with minimal relief. Planning on taking nyquil tonight. Asking if can be seen in clinic tomorrow.  Has had once before many years ago when at [facility].     Chart review shows treatment 2013 for persistent cough/bronchtis - zpack, tessalon, chertussin - unclear if took cheratussin    Plan- will have office visit tomorrow   Libi Corso P. Kamilla Hands,MD, 05/03/2014, 7:48 PM

## 2014-05-03 NOTE — Progress Notes (Unsigned)
approved

## 2014-05-04 ENCOUNTER — Ambulatory Visit (HOSPITAL_BASED_OUTPATIENT_CLINIC_OR_DEPARTMENT_OTHER): Payer: HMO | Admitting: Internal Medicine

## 2014-05-04 ENCOUNTER — Encounter (HOSPITAL_BASED_OUTPATIENT_CLINIC_OR_DEPARTMENT_OTHER): Payer: Self-pay | Admitting: Internal Medicine

## 2014-05-04 VITALS — BP 114/61 | HR 88 | Temp 98.8°F | Wt 111.0 lb

## 2014-05-04 DIAGNOSIS — J069 Acute upper respiratory infection, unspecified: Principal | ICD-10-CM

## 2014-05-04 MED ORDER — GUAIFENESIN-CODEINE 100-10 MG/5ML PO SOLN
5.0000 mL | Freq: Every evening | ORAL | Status: AC
Start: 2014-05-04 — End: 2014-05-14

## 2014-05-04 NOTE — Progress Notes (Signed)
Linda Rodriguez is a 64 year old female here for cough    Patient Active Problem List:     Mitral valve disorders     CALCULUS OF KIDNEY     HYPERLIPIDEMIA NEC/NOS     Family history of diabetes mellitus     Postmenopausal Atrophic Vaginitis     Generalized osteoarthrosis, involving hand     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     Atypical Chest Pain     Vitamin d deficiency     Hyperopia     Blepharitis     Elevated hemoglobin A1c     Special screening for malignant neoplasms, colon     Family history of malignant neoplasm of gastrointestinal tract     Basal cell carcinoma of nose     Right shoulder pain    Wednesday on  Continued cough, some SOB  Has not been sleeping well  Hard to talk at times with cough - more SOB - 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    Weight as of this encounter: 50.349 kg (111 lb).  Gen - Older female upright in chair in NAD  HEENT - Frontal and maxillary areas without TTP.  PERRLA, EOMI.  EAC without cerumen impaction, TM non-erythematous bilaterally.  MMM, posterior pharynx without erythema or exudate  Neck - FROM, no LAD  CV - RRR, no m/r/g  Resp - CTAB  Abd - +BS, soft, NT/ND  Psych - Appropriate    A/P:  (J06.9) Viral URI with cough  (primary encounter diagnosis)  Comment: No signs sinusitis, AOM, strep throat, or PNA, likely viral URI  Plan: Discussed supportive care with rest, hydration, tea with honey, humidified air.  Advised to call or return to clinic if has fever, SOB, CP, worsening symptoms or persistent symptoms after 1-2 weeks.    Plan: guaiFENesin-codeine (CHERATUSSIN AC) 100-10          MG/5ML liquid

## 2014-05-07 ENCOUNTER — Other Ambulatory Visit (HOSPITAL_BASED_OUTPATIENT_CLINIC_OR_DEPARTMENT_OTHER): Payer: Self-pay | Admitting: Internal Medicine

## 2014-05-07 DIAGNOSIS — Z0000 Encounter for general adult medical examination without abnormal findings: Principal | ICD-10-CM

## 2014-05-07 NOTE — Progress Notes (Signed)
Unclear   1)what this referral is for - routine adult health maintenance to derm?  2) why she has to go externally    Please clarify!  Thanks!  [employer]

## 2014-06-24 ENCOUNTER — Telehealth (HOSPITAL_BASED_OUTPATIENT_CLINIC_OR_DEPARTMENT_OTHER): Payer: Self-pay | Admitting: Registered Nurse

## 2014-06-24 NOTE — Progress Notes (Signed)
Returned call to pt  Left message to call clinic  Per provider can have labs done prior to appt but may need to have additional labs at visit  Per provider best to wait

## 2014-06-24 NOTE — Telephone Encounter (Signed)
-----   Message from [name] sent at 06/24/2014  2:34 PM EDT -----  Regarding: wants to come in for her fasting labs,tyroid, cholerstrol labs wants to make sure orders are  in place       Linda Rodriguez [id number], 64 year old, female, Telephone Information:  Home Phone      [phone]  Work Phone      Not on file.  Mobile          [phone]      Patient's Preferred Pharmacy:     [facility] - [address], [place]  Phone: [phone] Fax: [phone]    [id number] - [address], [address]  Phone: [phone] Fax: [phone]      CONFIRMED TODAY: Yes    CALL BACK NUMBER: [phone]    Best time to call back:   Cell phone:   Other phone:    Available times:    Patient's language of care: English    Patient does not need an interpreter.    Patient's PCP: Alinda Deem, MD, MD    Person calling on behalf of patient:wants to come in for her fasting labs,tyroid, cholerstrol labs wants to make sure orders are  in place     Don't think that cholestral ,fasting do not need to be ordered.    Maybe the thyroid?  Calls today

## 2014-06-25 ENCOUNTER — Other Ambulatory Visit (HOSPITAL_BASED_OUTPATIENT_CLINIC_OR_DEPARTMENT_OTHER): Payer: Self-pay | Admitting: Physician Assistant

## 2014-06-25 DIAGNOSIS — Z1239 Encounter for other screening for malignant neoplasm of breast: Secondary | ICD-10-CM

## 2014-06-26 ENCOUNTER — Ambulatory Visit: Payer: Self-pay | Admitting: Internal Medicine

## 2014-06-26 LAB — MA SCREENING MAMMO BILATERAL WITH CAD

## 2014-06-28 NOTE — Progress Notes (Signed)
Returned call to pt  Pt informed of message from  Provider  Pt will wait and have labs done at the time of visit

## 2014-07-10 ENCOUNTER — Ambulatory Visit (HOSPITAL_BASED_OUTPATIENT_CLINIC_OR_DEPARTMENT_OTHER): Payer: HMO | Admitting: Internal Medicine

## 2014-07-10 VITALS — BP 118/62 | HR 77 | Temp 97.2°F | Ht 60.24 in | Wt 110.0 lb

## 2014-07-10 DIAGNOSIS — E559 Vitamin D deficiency, unspecified: Secondary | ICD-10-CM

## 2014-07-10 DIAGNOSIS — K219 Gastro-esophageal reflux disease without esophagitis: Secondary | ICD-10-CM

## 2014-07-10 DIAGNOSIS — E7800 Pure hypercholesterolemia, unspecified: Principal | ICD-10-CM

## 2014-07-10 DIAGNOSIS — R634 Abnormal weight loss: Secondary | ICD-10-CM

## 2014-07-10 DIAGNOSIS — Z800 Family history of malignant neoplasm of digestive organs: Secondary | ICD-10-CM

## 2014-07-10 DIAGNOSIS — R7309 Other abnormal glucose: Secondary | ICD-10-CM

## 2014-07-10 NOTE — Progress Notes (Signed)
Notes she is no longer allergic to pear.     Switching between atorvastatin sometimes and ciprofibrate from [place]    Also having heartburn - with almost any food  Rice, pasta, breads, sweets - flour stuff  Calcium, aspirin give her reflux  The second she eating something she gets heartburn  Any acid causes reflux  TUms helps sometimes, not other times  Taking otc prn famotidine 10 mg - if she skips one day she gets the reflux - she started taking this a month ago  She thinks she started getting the symptoms 6 months ago, it has become more constant, daily  Sometimes but not often she has acid coming up  No pain or difficulty with swallowing  No change in stool      Weight loss - she notes her weight is stable at 112-113 lbs  Years ago she was 116 lbs  But she has been noticing that her pants are getting looser  She has been noticing this since oct/nov of last year  Even the woman at the mammogram and she herself noticed that her breasts have gotten smaller  No fevers, no night, sweats no cough  Was eating a big salad for dinner, reduced a bit of rice since last year  She doesn't feel  Like she is continuing to lose weight,   She doesn't feel like all o fher pants are looser (not the corduroys or jeans  Had been eating differently over lent as well - no cookies, etc  She is worried about it, worried it could be cancer  Notes that due to weight loss teh bone when she sits down hurts on her right buttock - not sure if she feels it more or if it is growing  No vaginal bleeding    She doesn't know why mitral fvalve disorder is in the problem list. Thinks maybe there was a murmur at somepoint    Patient Active Problem List:     Mitral valve disorders     CALCULUS OF KIDNEY     Pure hypercholesterolemia     Family history of diabetes mellitus     Postmenopausal Atrophic Vaginitis     Generalized osteoarthrosis, involving hand     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     Atypical Chest Pain     Vitamin D  deficiency     Hyperopia     Blepharitis     Elevated hemoglobin A1c     Family history of malignant neoplasm of gastrointestinal tract     Basal cell carcinoma of nose     Right shoulder pain    BP 118/62 mmHg   Pulse 77   Temp(Src) 97.2 F (36.2 C) (Temporal)   Ht 5' 0.24" (1.53 m)   Wt 49.896 kg (110 lb)   BMI 21.31 kg/m2   SpO2 97%  Pain Score: 0 (0/10)    GEN: pleasant, comfortable  HEENT: anicteric, no injection,  CV: RRR, no m/r/g   ABD: + BS, soft, NT/ND, no hsm, no masses  EXTREM: wwp, no c/c/e  NEURO: A+Ox3, nl gait  PSYCH: nl affect    A/P  (E78.0) Pure hypercholesterolemia  (primary encounter diagnosis)  Plan: COLLECTION VENOUS BLOOD VENIPUNCTURE, LIPID         PANEL, CANCELED: LIPID PANEL  dsicussion re swithcing between meds and interaction, adn decreasing number of medications  Trial only atorvastatin daily for 6 weeks, then recheck labs    (E55.9) Vitamin D deficiency  Plan: VITAMIN D,25 HYDROXY  She would liek to rehceck, reasonable as low in past    (K21.9) Gastroesophageal reflux disease, esophagitis presence not specified  (Z80.0) Family history of malignant neoplasm of gastrointestinal tract  UTD on colonoscopy  (R63.4) Abnormal loss of weight  Weight loss seems to be minimal per actual weight, though she has noticed bodily changees and is concerned  There are also possible explanations for her weight loss  Plan: REFERRAL TO OPEN ACCESS UPPER ENDOSCOPY (EGD)  We discussed  q5y interval given hx, will consdier new colonoscoyp if she continues to have weight loss and no other source found  She would alos like to do further blodo testing, but in future when she does the choelsterol testing  She wants to try to increase her weight at home        CBC + PLT + AUTO DIFF, COMPREHENSIVE METABOLIC         PANEL, THYROID SCREEN TSH REFLEX FT4      (R73.09) Elevated hemoglobin A1c  Plan: HEMOGLOBIN A1C  Recheck given high in past    Mitral valve - discussed, wants to hold off on echo though i think it  would be good to have one done to evaluate  Will wait    rtc 6-7 weeks, do blodowokr before then

## 2014-07-15 ENCOUNTER — Telehealth (HOSPITAL_BASED_OUTPATIENT_CLINIC_OR_DEPARTMENT_OTHER): Payer: Self-pay

## 2014-07-15 NOTE — Progress Notes (Signed)
Unable to reach pt to discuss Gi procedure. Left message with contact info [phone]

## 2014-07-18 ENCOUNTER — Ambulatory Visit: Payer: Self-pay | Admitting: Internal Medicine

## 2014-07-18 DIAGNOSIS — E7800 Pure hypercholesterolemia, unspecified: Principal | ICD-10-CM

## 2014-07-18 DIAGNOSIS — R7309 Other abnormal glucose: Secondary | ICD-10-CM

## 2014-07-18 DIAGNOSIS — R634 Abnormal weight loss: Secondary | ICD-10-CM

## 2014-07-18 DIAGNOSIS — E559 Vitamin D deficiency, unspecified: Secondary | ICD-10-CM

## 2014-07-18 LAB — CBC, PLATELET & DIFFERENTIAL
ABSOLUTE BASO COUNT: 0 10*3/uL (ref 0.0–0.1)
ABSOLUTE EOSINOPHIL COUNT: 0.2 10*3/uL (ref 0.0–0.8)
ABSOLUTE IMM GRAN COUNT: 0.01 10*3/uL (ref 0.00–0.03)
ABSOLUTE LYMPH COUNT: 2.9 10*3/uL (ref 0.6–5.9)
ABSOLUTE MONO COUNT: 0.6 10*3/uL (ref 0.2–1.4)
ABSOLUTE NEUTROPHIL COUNT: 3 10*3/uL (ref 1.6–8.3)
BASOPHIL %: 0.6 % (ref 0.0–1.2)
EOSINOPHIL %: 2.5 % (ref 0.0–7.0)
HEMATOCRIT: 41.7 % (ref 34.1–44.9)
HEMOGLOBIN: 13.4 g/dL (ref 11.2–15.7)
IMMATURE GRANULOCYTE %: 0.1 % (ref 0.0–0.4)
LYMPHOCYTE %: 42.9 % (ref 15.0–54.0)
MEAN CORP HGB CONC: 32.1 g/dL (ref 31.0–37.0)
MEAN CORPUSCULAR HGB: 28.5 pg (ref 26.0–34.0)
MEAN CORPUSCULAR VOL: 88.5 fL (ref 80.0–100.0)
MEAN PLATELET VOLUME: 10.9 fL (ref 8.7–12.5)
MONOCYTE %: 9 % (ref 4.0–13.0)
NEUTROPHIL %: 44.9 % (ref 40.0–75.0)
PLATELET COUNT: 378 10*3/uL (ref 150–400)
RBC DISTRIBUTION WIDTH STD DEV: 44.1 fL (ref 35.1–46.3)
RBC DISTRIBUTION WIDTH: 13.8 % (ref 11.5–14.3)
RED BLOOD CELL COUNT: 4.71 M/uL (ref 3.90–5.20)
WHITE BLOOD CELL COUNT: 6.7 10*3/uL (ref 4.0–11.0)

## 2014-07-18 LAB — COMPREHENSIVE METABOLIC PANEL
ALANINE AMINOTRANSFERASE: 33 U/L (ref 12–45)
ALBUMIN: 4 g/dL (ref 3.4–5.0)
ALKALINE PHOSPHATASE: 68 U/L (ref 45–117)
ANION GAP: 7 mmol/L (ref 5–15)
ASPARTATE AMINOTRANSFERASE: 26 U/L (ref 8–34)
BILIRUBIN TOTAL: 0.3 mg/dL (ref 0.2–1.0)
BUN (UREA NITROGEN): 21 mg/dL — ABNORMAL HIGH (ref 7–18)
CALCIUM: 9.2 mg/dL (ref 8.5–10.1)
CARBON DIOXIDE: 29 mmol/L (ref 21–32)
CHLORIDE: 107 mmol/L (ref 98–107)
CREATININE: 1 mg/dL (ref 0.4–1.2)
ESTIMATED GLOMERULAR FILT RATE: 56 mL/min — ABNORMAL LOW (ref >60)
Glucose Random: 99 mg/dL (ref 74–160)
POTASSIUM: 4.8 mmol/L (ref 3.5–5.1)
SODIUM: 143 mmol/L (ref 136–145)
TOTAL PROTEIN: 7.7 g/dL (ref 6.4–8.2)

## 2014-07-18 LAB — LIPID PANEL
Cholesterol: 195 mg/dL (ref 0–239)
HIGH DENSITY LIPOPROTEIN: 83 mg/dL (ref 40–?)
LOW DENSITY LIPOPROTEIN DIRECT: 108 mg/dL (ref 0–189)
TRIGLYCERIDES: 109 mg/dL (ref 0–150)

## 2014-07-18 LAB — VITAMIN D,25 HYDROXY: VITAMIN D,25 HYDROXY: 30 ng/mL (ref 30.0–100.0)

## 2014-07-18 LAB — THYROID SCREEN TSH REFLEX FT4: THYROID SCREEN TSH REFLEX FT4: 2.21 u[IU]/mL (ref 0.358–3.740)

## 2014-07-19 LAB — HEMOGLOBIN A1C
ESTIMATED AVERAGE GLUCOSE: 120 (ref 74–160)
HEMOGLOBIN A1C: 5.8 % — ABNORMAL HIGH (ref 4.0–5.6)

## 2014-08-06 ENCOUNTER — Encounter (HOSPITAL_BASED_OUTPATIENT_CLINIC_OR_DEPARTMENT_OTHER): Payer: Self-pay | Admitting: Internal Medicine

## 2014-08-06 ENCOUNTER — Ambulatory Visit (HOSPITAL_BASED_OUTPATIENT_CLINIC_OR_DEPARTMENT_OTHER): Payer: HMO | Admitting: Internal Medicine

## 2014-08-06 VITALS — BP 131/72 | HR 77 | Temp 97.1°F | Wt 110.0 lb

## 2014-08-06 DIAGNOSIS — J301 Allergic rhinitis due to pollen: Secondary | ICD-10-CM

## 2014-08-06 DIAGNOSIS — R7309 Other abnormal glucose: Secondary | ICD-10-CM

## 2014-08-06 DIAGNOSIS — E7800 Pure hypercholesterolemia, unspecified: Secondary | ICD-10-CM

## 2014-08-06 DIAGNOSIS — I38 Endocarditis, valve unspecified: Secondary | ICD-10-CM

## 2014-08-06 DIAGNOSIS — R944 Abnormal results of kidney function studies: Secondary | ICD-10-CM

## 2014-08-06 DIAGNOSIS — Z0000 Encounter for general adult medical examination without abnormal findings: Principal | ICD-10-CM

## 2014-08-06 DIAGNOSIS — Z13820 Encounter for screening for osteoporosis: Secondary | ICD-10-CM

## 2014-08-06 MED ORDER — FLUTICASONE PROPIONATE 50 MCG/ACT NA SUSP
1.00 | Freq: Every day | NASAL | 6 refills | Status: AC
Start: 2014-08-06 — End: 2015-08-06

## 2014-08-06 NOTE — Progress Notes (Signed)
Here for CPE  She did her bloodwork but too soon - notes she note sshe knows it was early but she had wanted to know why she had back/hip pain, so she did the labs early - she realized she thinks it was from stretching. Pain is completely gone now    Last time she had agreed to just stick to atorvastatin for six week - but she did not do this. Wondering why she should do this if her chol has been well controlled    Questions about her labs  Would like a refill of flonase    Her heartburn is completely gone - so she did left GI know.    Sees dental and eye regularly     Review of Systems   Constitutional: Negative.    HENT: Negative.    Eyes: Negative.    Respiratory: Negative.    Gastrointestinal: Negative.    Genitourinary: Negative.    Musculoskeletal: Negative.    Skin: Negative.    Neurological: Negative.    Endo/Heme/Allergies: Negative.    Psychiatric/Behavioral: Negative.      Patient Active Problem List    Right shoulder pain         Date Noted: 05/11/2013      Basal cell carcinoma of nose         Date Noted: 10/10/2012            Moh's Dr. Tonna Corner      Family history of malignant neoplasm of gastrointestinal tract         Date Noted: 08/06/2011            Colon cancer            q5 year colonoscopy      Elevated hemoglobin A1c         Date Noted: 01/19/2011                        HEMOGLOBIN A1C (%)             Date  Value              01/18/2011  5.9*             ----------                  Hyperopia         Date Noted: 04/29/2009      Blepharitis         Date Noted: 04/29/2009      Vitamin D deficiency         Date Noted: 11/20/2008                              Atypical chest pain         Date Noted: 11/01/2008            Admission 10/2008 MI ruled out ETT/mibi normal,            probably chest wall after             exercise      Allergic rhinitis due to pollen         Date Noted: 11/18/2007      Generalized osteoarthrosis, involving hand         Date Noted: 06/17/2007      Family history of diabetes  mellitus  Date Noted: 08/25/2005                                          Postmenopausal atrophic vaginitis         Date Noted: 08/25/2005            Replens      Mitral valve disorders         Date Noted: 08/10/2004            History of rheumatic fever            Echo 9/01 mild mitral regurgitation      Calculus of kidney         Date Noted: 08/10/2004      Pure hypercholesterolemia         Date Noted: 08/10/2004            LDL 194                      Past Medical History    Calculus of kidney 08/10/2004    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 Lorelle Formosa "ECC inadequate but not suspicious clinically for abnormality; follow one year pap as planned." 3/09 Pap insufficient cells, HPV negative 9/09 Pap normal HPV positive repeat colpo after topical estrogens to facilitate ECC normal Pap 11/2008 WNL HPV negative, return to routine screening 03/2010@ patient request HPV negative, Pap no transformation zone, no rep    Elevated blood pressure reading without diagnosis of hypertension 05/17/2011    Comment: Most Recent BP Reading(s) 04/05/12 : 120/68 08/20/11 : 120/60 08/18/11 : 103/57    Patient reports February BP readings 110/54, 114/70 and states that since her life has become less stressful, her BP decreased.   Most Recent BP Reading(s) 01/27/11 : 148/72 04/08/10 : 118/60 05/02/09 : 112/64 11/20/08 : 110/68 10/30/08 : 112/60      HYPERLIPIDEMIA NEC/NOS 08/10/2004    Comment: LDL 194    menopause 2001 08/10/2004    MITRAL VALVE DISORDER 08/10/2004    Comment: History of rheumatic fever Echo 9/01 mild mitral regurgitation    Presbyopia 04/29/2009         Past Surgical History    NIPPLE/AREOLA RECONSTRUCTION  1995    Comment breast reduction    BREAST REDUCTION SURGERY           Current Outpatient Prescriptions on File Prior to Visit:  Calcium Carbonate-Vitamin D (CALCIUM 600-D) 600-400 MG-UNIT TABS Tablet Take 1 tablet by mouth daily. for prevention of  osteoporosis Disp: 60 tablet Rfl: 12   Cholecalciferol (VITAMIN D) 2000 UNIT CAPS Take 1 capsule by mouth daily. Disp: 1 capsule Rfl: 0   PX ENTERIC ASPIRIN 81 MG OR TBEC 1 tablet by mouth daily Disp:  Rfl: 12   ATORVASTATIN CALCIUM 10 MG OR TABS one every other day Disp:  Rfl: 0   OTHER MEDICATION ciprofibrate 100 mg from [place] (a fibric acid)  one every other day Disp:  Rfl: 0     No current facility-administered medications on file prior to visit.       Social History   Marital status: Single  Spouse name: N/A    Years of education: N/A  Number of children: N/A     Occupational History  translator [employer]      Social  History Main Topics   Smoking status: Never Smoker    Smokeless tobacco: Never Used    Alcohol use Yes  0.0 oz/week     Comment: less than weekly    Drug use: No    Sexual activity: Not Currently    Partners: Male     Other Topics Concern    Military Service No    Blood Transfusions No    Caffeine Concern No    Occupational Exposure No    Sleep Concern No    Stress Concern No    Weight Concern No    Special Diet No    Exercise Yes    Comment: stopped zumba due to hip pain, plans to restart    Seat Belt Yes    Self-Exams Yes     Social History Narrative    Pt  Is single and works as an Astronomer for [employer] History    Heart Father     Comment: MI age 5    Osteoporosis Mother     Asthma Sister     Asthma Brother     Diabetes Brother     Theatre stage manager Abuse Brother     Cataracts Father     Hypertension Sister     Hypertension Brother     Diabetes Brother     Cancer - Breast FamHxNeg     Cancer - Colon Sister    sister required aortive valve replacement    Review of Patient's Allergies indicates:   Diclofenac potassium    Itching   Propoxyphene            Hives    Comment:After taking a propoxyphene/ASA combination             Tolerates Exedrin    BP 131/72  Pulse 77  Temp 97.1 F (36.2 C) (Temporal)  Wt 49.9 kg (110 lb)  SpO2 98%  BMI 21.31 kg/m2  Pain Score: 0 (0/10)        GEN: pleasant,  comfortable  HEENT: anicteric, no injection, OP nl, TMs nl, thyroid/neck nl without LAD, nares nl  CV: RRR, no m/r/g  PULM: CTAB, no w/r/r  ABD: + BS, soft, NT/ND, no hsm  EXTREM: wwp, no c/c/e  NEURO: A+Ox3, nl DTRs UE and LE b/l symmetric, nl gross sensation and strength, nl gait  PSYCH: nl affect    A/P  (Z00.00) Preventative health care  Healthy lifestyle, diet, exercise discussed  Encourage dental and optometry      (R73.09) Elevated hemoglobin A1c  Reviewed, discussed    (E78.0) Pure hypercholesterolemia  Plan: CHOLESTEROL, HIGH DENSITY LIPOPROTEIN, LOW         DENSITY LIPOPROTEIN,DIRECT, COLLECTION VENOUS         BLOOD VENIPUNCTURE, CANCELED: CHOLESTEROL,         CANCELED: HIGH DENSITY LIPOPROTEIN, CANCELED:         LOW DENSITY LIPOPROTEIN,DIRECT, CANCELED:         COLLECTION VENOUS BLOOD VENIPUNCTURE  Discussed trying to have as few meds as possible due to r/se  Agrees to statin for 6 weeks only without other chol meds and then rehceck    (J30.1) Allergic rhinitis due to pollen  Refill flonase    (R94.4) Decreased glomerular filtration rate  Plan: BASIC METABOLIC PANEL  Discussed, recheck    (Y70.623) Screening for osteoporosis  Plan: XR BONE DENSITOMETRY, LUMBAR/HIPS  When she is 83    (I38) Heart valve disorder  Plan: REFERRAL TO CARDIO-PULMONARY LAB ( INT)  Has fam history of cardiac issues, and her own possible history of valve disease    rtc prn

## 2014-08-26 ENCOUNTER — Ambulatory Visit: Payer: Self-pay | Admitting: Internal Medicine

## 2014-08-26 DIAGNOSIS — Z13820 Encounter for screening for osteoporosis: Principal | ICD-10-CM

## 2014-08-27 ENCOUNTER — Ambulatory Visit
Admit: 2014-08-27 | Discharge: 2014-08-27 | Disposition: A | Discharge status: Home / Self Care | Payer: Self-pay | Source: Ambulatory Visit | Attending: Internal Medicine | Admitting: Internal Medicine

## 2014-08-27 LAB — ECHOCARDIOGRAM W/ DOPPLER

## 2014-09-02 NOTE — Addendum Note (Signed)
Addended byArdelle Balls on: 09/02/2014 01:49 PM     Modules accepted: Orders

## 2014-09-03 LAB — XR DXA BONE DENSITOMETRY

## 2014-09-05 ENCOUNTER — Telehealth (HOSPITAL_BASED_OUTPATIENT_CLINIC_OR_DEPARTMENT_OTHER): Payer: Self-pay | Admitting: Ambulatory Care

## 2014-09-05 NOTE — Progress Notes (Signed)
Spoke with pt who is requesting echo results  Message sent to PCP, please advise  Linda Rodriguez J. Linda Lunn, RN, 09/05/2014, 2:30 PM

## 2014-09-11 NOTE — Progress Notes (Signed)
Message to provider to review and advise          Results:  [facility]   [place]: Danny Lawless T    DOB: 1950/12/06 AGE: 64  SEX: F    ACCT#: [id number] LOCATION: [facility]    UNIT#: [id number] STATUS: REG REF    DATE: 08/27/14 0802    ORD PHY: Alinda Deem MD    PCP: Alinda Deem, MD      Procedure CPT:    Procedure: ECHOCARDIOGRAM WITH DOPPLER                CONCLUSIONS    -----------    1. Left ventricular size and wall thicknesses are normal.    2. LV ejection fraction is 65%.    3. There are no regional wall motion abnormalities.    4. Doppler indicies show no evidence of diastolic dysfunction.    5. Both mitral valve leaflets are mildly thickened. . There is mild regurgitation.  6. There is insufficient tricuspid regurgitation to estimate PA systolic pressure. 7. Click the 'View Image' link below to view the full report.    Electronically signed off MD:EKIYJG Lavada Mesi, MD      _________________________________    Electronically Signed by: Teodoro Kil MD    Signed on: 08/27/14

## 2014-09-12 NOTE — Progress Notes (Signed)
Please call patient and tell her that I have reviewed her recent echo (ultrsound of her heart) and besides mild mitral regurgitation and mild thickening of the valve, her heart structure is normal.    Thank you  Virgel Gess, PA-C

## 2014-09-12 NOTE — Progress Notes (Signed)
Called pt and discussed results message from provider  Verbalized understanding  Would like results released to my chart  Message to provider

## 2014-11-07 ENCOUNTER — Telehealth (HOSPITAL_BASED_OUTPATIENT_CLINIC_OR_DEPARTMENT_OTHER): Payer: Self-pay | Admitting: Registered Nurse

## 2014-11-07 ENCOUNTER — Ambulatory Visit: Payer: Self-pay | Admitting: Internal Medicine

## 2014-11-07 DIAGNOSIS — R7309 Other abnormal glucose: Principal | ICD-10-CM

## 2014-11-07 NOTE — Telephone Encounter (Signed)
-----   Message from Jesse Fall sent at 11/07/2014 11:47 AM EDT -----  Regarding: patient is calling to have new lab orders put in they expired  Contact: [place] [id number], 64 year old, female    Calls today:  Clinical Questions ([place])    Name of person calling Linda Rodriguez   Specific nature of request Patient wants lab in for cholesterol to get checked   Return phone number ##  Test Results  Questions about rest results received    Person calling on behalf of patient: Patient (self)    CALL BACK NUMBER: [phone]  Best time to call back:   Cell phone:   Other phone:    Patient's language of care: English    Patient does not need an interpreter.    Patient's PCP: Alinda Deem, MD, MD

## 2014-11-07 NOTE — Progress Notes (Signed)
Orders placed. Left VM for pt labs entered

## 2014-11-08 ENCOUNTER — Encounter (HOSPITAL_BASED_OUTPATIENT_CLINIC_OR_DEPARTMENT_OTHER): Payer: Self-pay | Admitting: Internal Medicine

## 2014-11-14 ENCOUNTER — Ambulatory Visit: Payer: Self-pay | Admitting: Internal Medicine

## 2014-11-14 DIAGNOSIS — R7309 Other abnormal glucose: Principal | ICD-10-CM

## 2014-11-14 LAB — BASIC METABOLIC PANEL
ANION GAP: 10 mmol/L (ref 5–15)
BUN (UREA NITROGEN): 15 mg/dL (ref 7–18)
CALCIUM: 9 mg/dL (ref 8.5–10.1)
CARBON DIOXIDE: 26 mmol/L (ref 21–32)
CHLORIDE: 105 mmol/L (ref 98–107)
CREATININE: 0.7 mg/dL (ref 0.4–1.2)
ESTIMATED GLOMERULAR FILT RATE: >60 mL/min (ref >60)
Glucose Random: 97 mg/dL (ref 74–160)
POTASSIUM: 4.3 mmol/L (ref 3.5–5.1)
SODIUM: 141 mmol/L (ref 136–145)

## 2014-11-14 LAB — LOW DENSITY LIPOPROTEIN DIRECT: LOW DENSITY LIPOPROTEIN DIRECT: 119 mg/dL (ref 0–189)

## 2014-11-14 LAB — HIGH DENSITY LIPOPROTEIN: HIGH DENSITY LIPOPROTEIN: 62 mg/dL (ref 40–?)

## 2014-11-14 LAB — CHOLESTEROL: Cholesterol: 216 mg/dL (ref 0–239)

## 2014-11-14 NOTE — Addendum Note (Signed)
Addended by: Marica Otter on: 11/14/2014 08:18 AM     Modules accepted: Orders

## 2014-11-19 ENCOUNTER — Encounter (HOSPITAL_BASED_OUTPATIENT_CLINIC_OR_DEPARTMENT_OTHER): Payer: Self-pay | Admitting: Internal Medicine

## 2014-11-19 DIAGNOSIS — M810 Age-related osteoporosis without current pathological fracture: Principal | ICD-10-CM

## 2014-11-27 DIAGNOSIS — M810 Age-related osteoporosis without current pathological fracture: Principal | ICD-10-CM | POA: Insufficient documentation

## 2014-11-27 NOTE — Progress Notes (Signed)
Results have previously been reviewed by Dr. Sara Chu and pt has been contacted via mychart.

## 2014-12-03 ENCOUNTER — Telehealth (HOSPITAL_BASED_OUTPATIENT_CLINIC_OR_DEPARTMENT_OTHER): Payer: Self-pay | Admitting: Registered Nurse

## 2014-12-03 NOTE — Progress Notes (Signed)
Incoming call from pt. She did not get the most recent results message from [place], the response from Dr Sara Chu on 11/27/14. Reviewed notes below with  Pt. She would like to know if fosamax has any side effects, and how long would she have to take it? Route to PCP for review and response. Dennison Nancy Duanne Limerick, RN, 12/03/2014, 4:19 PM      Hi Linda Rodriguez,   Sorry for the delay! I hope you had a great summer. The echocardiogram was mostly normal, it did show some thickening of the valves and mild regurgitation (blood flowing back a little bit), but nothing severe and these are very common and to be expected!   Your bone density testing does show that you have osteoporosis, which puts you at high risk of having a fracture. If you are not taking a combined Calcium and Vitamin D pill, it would be a good idea - you can get it over the counter. Also, I would recommend we start you on a once weekly medication call Fosamax which can help rebuild your bones. If you agree to this, I can explain more and send in the prescription.   Best,   Dr. Carlean Jews.

## 2014-12-10 ENCOUNTER — Encounter (HOSPITAL_BASED_OUTPATIENT_CLINIC_OR_DEPARTMENT_OTHER): Payer: Self-pay | Admitting: Physician Assistant

## 2014-12-10 NOTE — Progress Notes (Signed)
Results reviewed as normal. Pt has been notified regarding her results. Pt has been following up with Dr. Sara Chu regarding her rise cholesterol.

## 2014-12-25 ENCOUNTER — Telehealth (HOSPITAL_BASED_OUTPATIENT_CLINIC_OR_DEPARTMENT_OTHER): Payer: Self-pay | Admitting: Registered Nurse

## 2014-12-25 NOTE — Progress Notes (Signed)
Received message pt had flu shot and PPD today  Wants chart updated  Entered as historical administration

## 2014-12-25 NOTE — Telephone Encounter (Signed)
-----   Message from Gwenevere Abbot sent at 12/25/2014  1:07 PM EDT -----  Regarding: Patient got Flu Shot Today and TB   Contact: [phone]  Linda Rodriguez [id number], 64 year old, female    Calls today:  Clinical Questions ([place])    Name of person calling [name]   Specific nature of request Patient would like her file update to reflect that she got her Flu and TB shot today  Patient does not require a call back please advise   Return phone number [phone]  Person calling on behalf of patient: Patient (self)    Patient's language of care: English    Patient does not need an interpreter.    Patient's PCP: Alinda Deem, MD, MD

## 2015-06-06 ENCOUNTER — Ambulatory Visit (HOSPITAL_BASED_OUTPATIENT_CLINIC_OR_DEPARTMENT_OTHER): Payer: HMO | Admitting: Ophthalmology

## 2015-06-06 DIAGNOSIS — H02826 Cysts of left eye, unspecified eyelid: Principal | ICD-10-CM

## 2015-06-06 DIAGNOSIS — H524 Presbyopia: Secondary | ICD-10-CM

## 2015-06-06 DIAGNOSIS — H5203 Hypermetropia, bilateral: Secondary | ICD-10-CM

## 2015-06-06 NOTE — Progress Notes (Signed)
.  Linda Rodriguez was seen in the [facility]  for a complaint of decreased vision.    She was examined and refracted and would benefit from glasses.  She had hyperopia and presbyopia.    She was given an eye glass prescription which they can get filled at the optical shop of their choice.    Options including over- the- counter readers, single vision readers, bifocals, and progressive add lenses were discussed with patient.    She had a small inclusion cyst on left lower lid margin.  She asked to have the lesion drained.  A sterill 22 gauge needle was used to incise the lesion and a waxy white material was expressed.  Ilotycin ung was applied.    She  was instructed to follow prn/3 years.

## 2015-06-06 NOTE — Nursing Note (Signed)
For evaluation of blurred vision and a a cyst of the left lower eye lid

## 2015-06-06 NOTE — Nursing Note (Signed)
Pt here for yearly exam.     Sees pretty well with glasses. RX is about 66 yrs old. Is comfortable with them. Did not get last year RX filled. Did not want it that strong.    Would like new RX but not as strong as last yr.     Had trouble in the past getting used to stronger RX's. Made pt very dizzy and floor coming up.

## 2015-06-20 ENCOUNTER — Telehealth (HOSPITAL_BASED_OUTPATIENT_CLINIC_OR_DEPARTMENT_OTHER): Payer: Self-pay | Admitting: Registered Nurse

## 2015-06-20 DIAGNOSIS — E7800 Pure hypercholesterolemia, unspecified: Secondary | ICD-10-CM

## 2015-06-20 DIAGNOSIS — R7309 Other abnormal glucose: Principal | ICD-10-CM

## 2015-06-20 NOTE — Telephone Encounter (Signed)
-----   Message from Mickle Asper sent at 06/20/2015  1:49 PM EDT -----  Regarding: Requesting an order to be submitted   Contact: [phone]  Linda Rodriguez [id number], 65 year old, female    Calls today:  Clinical Questions ([place])    Name of person calling Patient  Specific nature of request PT is booked for PE appt with Gordy Levan on 08/07/15 -- but she wants an order to be done for blood work prior to her appt   Return phone number [phone]  Person calling on behalf of patient: Patient (self)      Patient's language of care: English    Patient does not need an interpreter.    Patient's PCP: Alinda Deem, MD, MD

## 2015-06-20 NOTE — Progress Notes (Signed)
Returned call to pt  Left voicemail message  Message states Pt inquiring if she can have lab prior to physical appt  Message to provider to review and advise

## 2015-06-30 ENCOUNTER — Other Ambulatory Visit (HOSPITAL_BASED_OUTPATIENT_CLINIC_OR_DEPARTMENT_OTHER): Payer: Self-pay | Admitting: Internal Medicine

## 2015-06-30 DIAGNOSIS — Z1231 Encounter for screening mammogram for malignant neoplasm of breast: Principal | ICD-10-CM

## 2015-07-01 ENCOUNTER — Ambulatory Visit: Payer: Self-pay | Admitting: Physician Assistant

## 2015-07-01 DIAGNOSIS — Z1231 Encounter for screening mammogram for malignant neoplasm of breast: Principal | ICD-10-CM

## 2015-07-01 DIAGNOSIS — Z411 Encounter for cosmetic surgery: Secondary | ICD-10-CM

## 2015-07-01 NOTE — Addendum Note (Signed)
Addended by: Rowan Blase on: 07/01/2015 05:32 PM     Modules accepted: Orders

## 2015-07-02 LAB — MA SCREENING MAMMO BILATERAL WITH CAD

## 2015-07-09 ENCOUNTER — Encounter (HOSPITAL_BASED_OUTPATIENT_CLINIC_OR_DEPARTMENT_OTHER): Payer: Self-pay | Admitting: Internal Medicine

## 2015-07-11 NOTE — Progress Notes (Signed)
Future order placed for fasting labs prior to May 2017 scheduled appt

## 2015-07-14 NOTE — Progress Notes (Signed)
Called to inform pt   Pt informed orders placed and informed of lab hours

## 2015-07-23 ENCOUNTER — Telehealth (HOSPITAL_BASED_OUTPATIENT_CLINIC_OR_DEPARTMENT_OTHER): Payer: Self-pay

## 2015-07-23 ENCOUNTER — Other Ambulatory Visit (HOSPITAL_BASED_OUTPATIENT_CLINIC_OR_DEPARTMENT_OTHER): Payer: Self-pay

## 2015-07-23 DIAGNOSIS — Z1283 Encounter for screening for malignant neoplasm of skin: Principal | ICD-10-CM

## 2015-07-23 NOTE — Progress Notes (Unsigned)
approved

## 2015-07-23 NOTE — Progress Notes (Unsigned)
Appointment scheduled for: Dermatology    Appointment scheduled [id number]    Specialty Location: Other: [address],   [place], [place]     Specialist's name: Dr Berton Mount Specialist's NPI#: [id number]   Specialty Phone Number: [phone] Specialty Fax Number: [phone]    Reason for appointment: routine skin check    Day of appt: Monday   Date of appt: 08/11/15  Time of appt: 8.15 am   Patient Notification: Phone call - Spoke with patient    If you have any questions concerning the referral authorization please call [phone].    If you have any questions concerning your appointment please call the specialty phone number above.

## 2015-08-07 ENCOUNTER — Ambulatory Visit (HOSPITAL_BASED_OUTPATIENT_CLINIC_OR_DEPARTMENT_OTHER): Payer: HMO | Admitting: Internal Medicine

## 2015-08-07 ENCOUNTER — Telehealth (HOSPITAL_BASED_OUTPATIENT_CLINIC_OR_DEPARTMENT_OTHER): Payer: Self-pay | Admitting: Internal Medicine

## 2015-08-07 ENCOUNTER — Encounter (HOSPITAL_BASED_OUTPATIENT_CLINIC_OR_DEPARTMENT_OTHER): Payer: Self-pay | Admitting: Internal Medicine

## 2015-08-07 VITALS — BP 136/69 | HR 78 | Temp 97.5°F | Ht 61.0 in | Wt 112.0 lb

## 2015-08-07 DIAGNOSIS — K219 Gastro-esophageal reflux disease without esophagitis: Secondary | ICD-10-CM

## 2015-08-07 DIAGNOSIS — M810 Age-related osteoporosis without current pathological fracture: Secondary | ICD-10-CM

## 2015-08-07 DIAGNOSIS — Z0000 Encounter for general adult medical examination without abnormal findings: Principal | ICD-10-CM

## 2015-08-07 DIAGNOSIS — J301 Allergic rhinitis due to pollen: Secondary | ICD-10-CM

## 2015-08-07 DIAGNOSIS — R7309 Other abnormal glucose: Secondary | ICD-10-CM

## 2015-08-07 DIAGNOSIS — Z23 Encounter for immunization: Secondary | ICD-10-CM

## 2015-08-07 LAB — CBC, PLATELET & DIFFERENTIAL
ABSOLUTE BASO COUNT: 0 10*3/uL (ref 0.0–0.1)
ABSOLUTE EOSINOPHIL COUNT: 0.1 10*3/uL (ref 0.0–0.8)
ABSOLUTE IMM GRAN COUNT: 0.02 10*3/uL (ref 0.00–0.03)
ABSOLUTE LYMPH COUNT: 2.9 10*3/uL (ref 0.6–5.9)
ABSOLUTE MONO COUNT: 0.8 10*3/uL (ref 0.2–1.4)
ABSOLUTE NEUTROPHIL COUNT: 2.9 10*3/uL (ref 1.6–8.3)
BASOPHIL %: 0.4 % (ref 0.0–1.2)
EOSINOPHIL %: 1.6 % (ref 0.0–7.0)
HEMATOCRIT: 41 % (ref 34.1–44.9)
HEMOGLOBIN: 13.1 g/dL (ref 11.2–15.7)
IMMATURE GRANULOCYTE %: 0.3 % (ref 0.0–0.4)
LYMPHOCYTE %: 43 % (ref 15.0–54.0)
MEAN CORP HGB CONC: 32 g/dL (ref 31.0–37.0)
MEAN CORPUSCULAR HGB: 29.2 pg (ref 26.0–34.0)
MEAN CORPUSCULAR VOL: 91.5 fL (ref 80.0–100.0)
MEAN PLATELET VOLUME: 11.2 fL (ref 8.7–12.5)
MONOCYTE %: 11.7 % (ref 4.0–13.0)
NEUTROPHIL %: 43 % (ref 40.0–75.0)
PLATELET COUNT: 354 10*3/uL (ref 150–400)
RBC DISTRIBUTION WIDTH STD DEV: 46.5 fL — ABNORMAL HIGH (ref 35.1–46.3)
RBC DISTRIBUTION WIDTH: 13.8 % (ref 11.5–14.3)
RED BLOOD CELL COUNT: 4.48 M/uL (ref 3.90–5.20)
WHITE BLOOD CELL COUNT: 6.8 10*3/uL (ref 4.0–11.0)

## 2015-08-07 LAB — BASIC METABOLIC PANEL
ANION GAP: 8 mmol/L (ref 5–15)
BUN (UREA NITROGEN): 17 mg/dL (ref 7–18)
CALCIUM: 9.2 mg/dL (ref 8.5–10.1)
CARBON DIOXIDE: 26 mmol/L (ref 21–32)
CHLORIDE: 107 mmol/L (ref 98–107)
CREATININE: 0.8 mg/dL (ref 0.4–1.2)
ESTIMATED GLOMERULAR FILT RATE: >60 mL/min (ref >60)
Glucose Random: 90 mg/dL (ref 74–160)
POTASSIUM: 4.8 mmol/L (ref 3.5–5.1)
SODIUM: 141 mmol/L (ref 136–145)

## 2015-08-07 LAB — LIPID PANEL
Cholesterol: 197 mg/dL (ref 0–239)
HIGH DENSITY LIPOPROTEIN: 61 mg/dL (ref 40–?)
LOW DENSITY LIPOPROTEIN DIRECT: 107 mg/dL (ref 0–189)
TRIGLYCERIDES: 191 mg/dL — ABNORMAL HIGH (ref 0–150)

## 2015-08-07 LAB — THYROID SCREEN TSH REFLEX FT4: THYROID SCREEN TSH REFLEX FT4: 1.66 u[IU]/mL (ref 0.358–3.740)

## 2015-08-07 LAB — VITAMIN D,25 HYDROXY: VITAMIN D,25 HYDROXY: 44 ng/mL (ref 30.0–100.0)

## 2015-08-07 MED ORDER — FLUTICASONE PROPIONATE 50 MCG/ACT NA SUSP
1.0000 | Freq: Every day | NASAL | 5 refills | Status: DC
Start: 2015-08-07 — End: 2015-08-07

## 2015-08-07 MED ORDER — FLUTICASONE PROPIONATE 50 MCG/ACT NA SUSP
1.0000 | Freq: Every day | NASAL | 5 refills | Status: AC
Start: 2015-08-07 — End: 2016-08-07

## 2015-08-07 NOTE — Telephone Encounter (Signed)
-----   Message from [name] sent at 08/07/2015  8:30 AM EDT -----  Regarding: PCV-13 benefit review  PCV-13 prior auth/pharmacy benefit review request

## 2015-08-07 NOTE — Patient Instructions (Addendum)
[facility]  [address]  [place] [address]  Clinical Nutrition Services    Calcium and Your Health    Calcium is a mineral that helps build strong bones and teeth, helps prevent brittle bones (osteoporosis) and hip fractures, and helps maintain a normal blood pressure. It is very important to get enough calcium each day.    * CALCIUM NEEDS AT ALL AGES: (RDI 2000)  Infants birth to 12 months  is 210 -270 mg/day          Child 1-8 years  is 500-.800 mg/day                     Adolescent 9-18years is 1300 mg/day                   Adults 19-30 years is 1000 mg/day  Adults 31-50 years is 1000 mg/day  Adults 51 and older is 1200 mg/day    * TO MAINTAIN GOOD BONE HEALTH, EAT A CALCIUM RICH DIET:    FOODS                                      Yogurt, plain, nonfat: 1 cup serving is 450 mgs of calcium    Ricotta cheese, part skim: 1/2 cup serving is 340 mgs of calcium    Milk, skim: 1 cup serving is 300 mgs of calcium    Milk, whole: 1 cup serving is 290 mgs of calcium    Sardines, canned, with bones: 3 oz serving is 280 mgs of calcium    Yogurt, plain, whole milk:1 cup serving is 275 mgs of calcium    Orange juice, calcium fortified:1 cup serving is 270 mgs of calcium    Swiss cheese: 1 oz serving is 270 mgs of calcium    Spinach, cooked: 1 cup serving is 240 mgs of calcium   Turnip greens, rhubarb, cooked:1 cup serving is 200 mgs of calcium   Salmon, canned, with bones: 3 oz serving is 180 mgs of calcium   Ice Cream: 1 cup serving is 175 mgs of calcium   Pudding, instant mix: 1/2 cup serving is 150 mgs of calcium   Almonds:  /2 cup serving is 150 mgs of calcium   Kale, cooked: 1 cup serving is 100 mgs of calcium   Lobster, cooked: 6 oz serving is 100 mgs of calcium   Tofu, with calcium sulfate:  3oz serving is 100 mgs of calcium     EXERCISE: Weight bearing exercises, such as walking, jogging, racquet sports, aerobics, and weight training  help make bones stronger.     VITAMIN D:  Vitamin D is essential to help your body absorb calcium. A healthy body can make its own vitamin D with the help of sunlight. But in the winter months, you may not get enough sun, and should make   sure you get it from another source, like milk or a multivitamin.     HORMONES: The hormone estrogen helps the female body and bones to use calcium. After menopause women need to discuss hormone treatment with their doctor.    * This is just a first step in helping improve your health.  * For help with a meal plan for you, make an appointment with the Nutritionist.  * For more information, you can also contact the National Nutrition Hotline 1-800-366-1655                                                                9/95 MAS 1/96, 12/97eqj, 01/02, 4/02    [place]  [id number] [place] [address]  Clinical Nutrition Services    Calcium and Your Health    Calcium is a mineral that helps build strong bones and teeth, helps prevent brittle bones (osteoporosis) and hip fractures, and helps maintain a normal blood pressure. It is very important to get enough calcium each day.    * CALCIUM NEEDS AT ALL AGES: (RDI 2000)  Infants birth to 12 months  is 210 -270 mg/day          Child 1-8 years  is 500-.800 mg/day                     Adolescent 9-18years is 1300 mg/day                   Adults 19-30 years is 1000 mg/day  Adults 31-50 years is 1000 mg/day  Adults 51 and older is 1200 mg/day    * TO MAINTAIN GOOD BONE HEALTH, EAT A CALCIUM RICH DIET:    FOODS                                      Yogurt, plain, nonfat: 1 cup serving is 450 mgs of calcium    Ricotta cheese, part skim: 1/2 cup serving is 340 mgs of calcium    Milk, skim: 1 cup serving is 300 mgs of calcium    Milk, whole: 1 cup serving is 290 mgs of calcium    Sardines, canned, with bones: 3 oz serving is 280 mgs of calcium    Yogurt, plain, whole milk:1 cup serving is 275 mgs of calcium    Orange juice, calcium fortified:1 cup  serving is 270 mgs of calcium    Swiss cheese: 1 oz serving is 270 mgs of calcium    Spinach, cooked: 1 cup serving is 240 mgs of calcium   Turnip greens, rhubarb, cooked:1 cup serving is 200 mgs of calcium   Salmon, canned, with bones: 3 oz serving is 180 mgs of calcium   Ice Cream: 1 cup serving is 175 mgs of calcium   Pudding, instant mix: 1/2 cup serving is 150 mgs of calcium   Almonds:  /2 cup serving is 150 mgs of calcium   Kale, cooked: 1 cup serving is 100 mgs of calcium   Lobster, cooked: 6 oz serving is 100 mgs of calcium   Tofu, with calcium sulfate:  3oz serving is 100 mgs of calcium     EXERCISE: Weight bearing exercises, such as walking, jogging, racquet sports, aerobics, and weight training  help make bones stronger.     VITAMIN D: Vitamin D is essential to help your body absorb calcium. A healthy body can make its own vitamin D with the help of sunlight. But in the winter months, you may not get enough sun, and should make   sure you get it from another source, like milk or a multivitamin.     HORMONES: The hormone estrogen helps the female body and bones to use calcium. After menopause women need to discuss hormone treatment with their doctor.    * This is just a first step in helping improve your health.  * For help with a meal plan for you, make an appointment with the Nutritionist.  * For more information, you  can also contact the [facility] [phone]                                                              9/95 MAS 1/96, 12/97eqj, 01/02, 4/02

## 2015-08-07 NOTE — Progress Notes (Unsigned)
Dr.Joseph from [facility] called the Central Refill Department to complete a benefit analysis for PCV13.    The vaccine is covered under the patients prescription coverage.    Please choose [phone] (Prior Auth/Pharmacy) and notify Central Refill via ccd chart once the vaccine has been administered.

## 2015-08-07 NOTE — Progress Notes (Addendum)
Chief Complaint:  Linda Rodriguez is a 65 year old female who presents for a physical exam.    Is a patient of Dr. Adrian Prows Density   Had a bone density scan last year   Inquired about results   Mother has history of osteoporosis   Post-menopausal   Is currently taking calcium 600-400 mg tabs and vitamin D 2000 unit    Heard that calcium may have risks associated with heart disease     Mitral Valve Disorders   Inquired about echocardiogram obtained in August 2-16  Denies chest pain  Denies heart palpitations     Was having heartburn in the past and acid reflux   Symptoms subsided with stopping eating salad for dinner   Was taking famotidine 10 mg   Drinks 1 cup of coffee a day     Derm   Has a dermatology appointment next week with outside physician - Dr. Leory Plowman    Patient Active Problem List:     Mitral valve disorders     Calculus of kidney     Pure hypercholesterolemia     Family history of diabetes mellitus     Postmenopausal atrophic vaginitis     Generalized osteoarthrosis, involving hand     Allergic rhinitis due to pollen     Atypical chest pain     Vitamin D deficiency     Hyperopia     Blepharitis     Elevated hemoglobin A1c     Family history of malignant neoplasm of gastrointestinal tract     Basal cell carcinoma of nose     Right shoulder pain     Osteoporosis        Current Outpatient Prescriptions:  Cholecalciferol (VITAMIN D) 2000 UNIT CAPS Take 1 capsule by mouth daily. Disp: 1 capsule Rfl: 0   PX ENTERIC ASPIRIN 81 MG OR TBEC 1 tablet by mouth daily Disp:  Rfl: 12   ATORVASTATIN CALCIUM 10 MG OR TABS one every other day Disp:  Rfl: 0   OTHER MEDICATION ciprofibrate 100 mg from [place] (a fibric acid)  one every other day Disp:  Rfl: 0     No current facility-administered medications for this visit.     Allergies:  Review of Patient's Allergies indicates:   Diclofenac potassium    Itching   Propoxyphene            Hives    Comment:After taking a propoxyphene/ASA combination              Tolerates Exedrin    Health Maintenance:  HEP C SCREEN (DOB [birth year]) due on 11/26/49  FALL RISK ASSESSMENT: OVER 65 due on 09/20/1949  HEPATITIS A VACCINE (ADULT)(2 of 2 - Standard Series) due on 02/16/2014  HEALTH CARE PROXY due on 08/14/2014  PNEUMOCOCCAL VACCINE PPSV-23 due on 08/14/2014  PNEUMOCOCCAL VACCINE PCV-13 due on 08/14/2014  PAP SMEAR due on 04/09/2015  HPV SCREENING due on 04/09/2015  PHYSICAL EXAM (AGE 70+) due on 08/06/2015  AWQ Questionnaire due on 08/06/2015  COLONOSCOPY due on 07/20/2016  MAMMOGRAPHY due on 06/30/2017  LIPID SCREENING due on 11/14/2019  TDAP/TD VACCINE(2 - Td) due on 04/05/2022  BONE DENSITY due on 09/01/2029  ZOSTER VACCINE,AGE 3 AND OLDER (ONCE) Completed  HEP B HIGH RISK VACCINE EVAL (ONCE) Completed  INFLUENZA VACCINE Completed    Immunizations:  Immunization History   Administered Date(s) Administered    HEP B ADULT 3 DOSE 20 and > 03/27/2004, 04/27/2004,  10/26/2004    Hepatitis A Adult 2 dose 08/16/2013    INFLUENZA VIRUS TRI W/PRESV VACCINE 18/> YRS IM (PRIVATE) 12/21/2010, 12/21/2011, 12/20/2012, 12/26/2013, 12/25/2014    PPD 07/01/2004, 12/20/2007, 01/27/2011, 12/25/2014    Td 03/23/1994, 07/03/2004    Tdap 04/05/2012    Typhoid Vi Capsular Polysaccharide 08/20/2013    ZOSTER SHINGLES VACC, LIVE SC 02/10/2011       Histories:  Past Medical History:  08/10/2004: Calculus of kidney  09/10/2005: 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 Lorelle Formosa "ECC               inadequate but not suspicious clinically for                abnormality; follow one year pap as planned."                3/09 Pap insufficient cells, HPV negative 9/09                Pap normal HPV positive repeat colpo after                topical estrogens to facilitate ECC normal Pap                11/2008 WNL HPV negative, return to routine                screening 03/2010@ patient request HPV negative,               Pap no transformation zone, no  rep  05/17/2011: Elevated blood pressure reading without diagno*      Comment:  Most Recent BP Reading(s) 04/05/12 : 120/68                08/20/11 : 120/60 08/18/11 : 103/57    Patient                reports February BP readings 110/54, 114/70 and               states that since her life has become less                stressful, her BP decreased.   Most Recent BP                Reading(s) 01/27/11 : 148/72 04/08/10 : 118/60                05/02/09 : 112/64 11/20/08 : 110/68 10/30/08 :                112/60    08/10/2004: HYPERLIPIDEMIA NEC/NOS      Comment: LDL 194  08/10/2004: menopause 2001  08/10/2004: MITRAL VALVE DISORDER      Comment: History of rheumatic fever Echo 9/01 mild                mitral regurgitation  04/29/2009: Presbyopia  Past Surgical History:  No date: BREAST REDUCTION SURGERY  1995: NIPPLE/AREOLA RECONSTRUCTION      Comment: breast reduction    Social History   Marital status: Single  Spouse name: N/A    Years of education: N/A  Number of children: N/A     Occupational History  translator NONE      Social History Main Topics   Smoking status: Never Smoker    Smokeless tobacco: Never Used    Alcohol use Yes  0.0 oz/week     Comment: less  than weekly    Drug use: No    Sexual activity: Not Currently    Partners: Male     Other Topics Concern    Military Service No    Blood Transfusions No    Caffeine Concern No    Occupational Exposure No    Sleep Concern No    Stress Concern No    Weight Concern No    Special Diet No    Exercise Yes    Comment: stopped zumba due to hip pain, plans to restart    Seat Belt Yes    Self-Exams Yes     Social History Narrative    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, no pets    Feels safe at home    [name], MD, 08/07/2015, 8:12 AM               Family History    Heart Father     Comment: MI age 63    Osteoporosis Mother     Asthma Sister     Diabetes Brother     Theatre stage manager Abuse Brother     Cataracts Father     Hypertension Sister      Hypertension Brother     Diabetes Brother     Cancer - Breast FamHxNeg     Cancer - Colon Sister     Diabetes Sister     Comment: sister required aortive valve replacement       Review of Systems:                   Skin: negative  Eyes: negative  Ears/Nose/Throat: negative  Respiratory: negative  Cardiovascular: negative  Gastrointestinal: negative  Genitourinary: negative  [id number] menses, no abnormal bleeding, pelvic pain or discharge  no breast pain or new or enlarging lumps on self exam  Musculoskeletal: negative  Neurologic: negative  Endocrine: negative  Psychiatric: negative  Hematologic/Lymphatic/Immunologic: negative    Physical:  BP 136/69   Pulse 78   Temp 97.5 F (36.4 C) (Temporal)   Ht 5\' 1"  (1.549 m)   Wt 50.8 kg (112 lb)   SpO2 99%   BMI 21.16 kg/m2  General appearance: healthy, alert, well developed, well nourished  Eyes: conjunctivae/corneas clear. PERRL, EOM's intact. Fundi benign  Skin: skin color, texture, turgor are normal. Few scattered moles present on back.   Head: Normocephalic. No masses, lesions, tenderness or abnormalities  Ears: External ears normal. Canals clear. TM's normal.  Nose/Sinuses: Nares normal. Septum midline. Mucosa normal. No drainage or sinus tenderness.  Oropharynx: Lips, mucosa, and tongue normal. Teeth and gums normal. Oropharynx moist and without lesion  Neck: Neck supple. No adenopathy. Thyroid symmetric, normal size, and without nodularity  Back: Back symmetric, no curvature. ROM normal. No CVA tenderness.  Lungs: Percussion normal. Good diaphragmatic excursion. Lungs clear to auscultation bilaterally  Heart: PMI normal. No lifts, heaves, or thrills. RRR. No murmurs, clicks, gallops or rubs  Breast: breasts symmetric, no dominant or suspicious mass, no skin or nipple changes, no axillary adenopathy and self exam in taught and encouraged  Abdomen: Abdomen soft, non-tender. BS normal. No masses, no organomegaly  Extremities: Extremities normal. No deformities,  edema, or skin discoloration  Musculoskeletal: Spine ROM normal. Muscular strength intact.  Peripheral pulses: radial=4/4, femoral=4/4, popliteal=4/4, dorsalis pedis=4/4  Neuro: Gait normal. Reflexes normal and symmetric. Sensation grossly normal   Pelvic: External genitalia and vagina normal. Bimanual and  rectovaginal exam normal  Rectal: deferred      XR Bone Densitometry, Lumbar/Hips (09/03/2014)   IMPRESSION: The lowest measured BMD is in the lumbar spine at L1-L4 at 0.788 gm/cm2 with a T score of -3.3. This patient is considered osteoporotic according to the [facility]. Fracture risk is considered high.        Health Counseling:  Smoking:  Reviewed and Discussed  Substance Use Issues:   Reviewed and Discussed   Seat Belts:  Reviewed and Discussed  Protective Sports Equipment:  Reviewed and Discussed  Smoke Dectectors:  Reviewed and Discussed  Diet, Exercise, Wt. Control:  Reviewed and Discussed  Dental Health:  Reviewed and Discussed  Vision Health:  Reviewed and Discussed  Mental Health:  Reviewed and Discussed  Domestic Violence:  Reviewed and Discussed  Sexual Health:  Reviewed and Discussed  Osteoperosis: Reviewed and Discussed  Health Care Proxy:  Reviewed and Discussed      ASSESSMENT/PLAN:  Routine general medical examination at a health care facility  (primary encounter diagnosis)  Elevated hemoglobin A1c  Age-related osteoporosis without current pathological fracture  Need for pneumococcal vaccination  Gastroesophageal reflux disease, esophagitis presence not specified     (Z00.00) Routine general medical examination at a health care facility  (primary encounter diagnosis)  Comment: Preventative maintenance discussed: self examinations, diet/exercise, substance abuse screening, mental health screening, sexual health, age appropriate cancer screening  Plan: Plan: HEMOGLOBIN A1C, LIPID PANEL, BASIC METABOLIC PANEL, THYROID SCREEN TSH REFLEX FT4, CBC + PLT+ AUTO DIFF,  COLLECTION VENOUS BLOOD VENIPUNCTURE    (M81.0) Age-related osteoporosis without current pathological fracture  Comment: Counseled and educated on calcium and vitamin D supplementation   Discussed with patient plan to stop calcium supplement and to obtain primarily from food sources including cheese, yogurt or milk. Stressed importance of getting enough vitamin D.  Plan: Discussed age-related risks and high FRAX given her recent DEXA results   She declines bisphosphonate therapy after discussion of risks and benefits  and recomnended getting evaluated by rheumatology - patient declined offer   Recommend vitamin D intake - supplements presently   Discontinue oral calcium supplements  VITAMIN D,25 HYDROXY    (R73.09) Elevated hemoglobin A1c  Comment: Reviewed labs and discussed at length with patient. Elevated A1c levels 5.8% per 07/18/14 labs.   Plan: Will recheck A1c today     (Z23) Need for pneumococcal vaccination  Comment: Discussed and educated on need for vaccination  Plan: PNEUMO VAC PCV-13 (PRIOR AUTH NEEDED)    (K21.9) Gastroesophageal reflux disease, esophagitis presence not specified  Comment: Discussed with patient about symptoms - currently mild  Plan: Continue treatment with famotidine     Patient not due for colonoscopy until next year '    She is overdue for Pap smear/cervical cancer screening. She will make an appointment with Gynecology      By signing my name below, I, Georgeanna Lea, attest that this documentation has been prepared under the direction and in the presence of Gordy Levan, M.D.  Electronically signed: Georgeanna Lea, Medical Scribe. 08/07/15 8:13 AM    I, [name], personally performed the services described in this documentation. All medical record entries made by the scribe were at my direction and in my presence. I have reviewed the chart and discharge instructions (if applicable) and agree that the record reflects my personal performance and is accurate and complete.   [name], MD, 08/07/2015, 8:58 AM

## 2015-08-08 DIAGNOSIS — Z0000 Encounter for general adult medical examination without abnormal findings: Principal | ICD-10-CM

## 2015-08-08 LAB — HEMOGLOBIN A1C
ESTIMATED AVERAGE GLUCOSE: 123 (ref 74–160)
HEMOGLOBIN A1C: 5.9 % — ABNORMAL HIGH (ref 4.0–5.6)

## 2015-08-11 DIAGNOSIS — L298 Other pruritus: Secondary | ICD-10-CM

## 2015-08-11 DIAGNOSIS — L820 Inflamed seborrheic keratosis: Principal | ICD-10-CM

## 2015-09-25 ENCOUNTER — Ambulatory Visit (HOSPITAL_BASED_OUTPATIENT_CLINIC_OR_DEPARTMENT_OTHER): Payer: No Typology Code available for payment source | Admitting: Obstetrics & Gynecology

## 2015-09-25 VITALS — BP 130/80 | Wt 112.0 lb

## 2015-09-25 DIAGNOSIS — N950 Postmenopausal bleeding: Secondary | ICD-10-CM

## 2015-09-25 DIAGNOSIS — N952 Postmenopausal atrophic vaginitis: Secondary | ICD-10-CM

## 2015-09-25 DIAGNOSIS — Z01411 Encounter for gynecological examination (general) (routine) with abnormal findings: Principal | ICD-10-CM

## 2015-09-25 DIAGNOSIS — Z124 Encounter for screening for malignant neoplasm of cervix: Secondary | ICD-10-CM

## 2015-09-25 NOTE — Progress Notes (Signed)
65 year old woman here for pelvic part of annual exam. She has seen her primary care provider for an annual exam in the last year.     Obstetric History   G0  P0  T0  P0  A0  TAB0  SAB0  E0  M0  L0        CC/HPI: last 2 years 2 episodes faint pinkish spotting  Needs pap    GYN History:  post menopausal  - limited use HRT  Past GYN History: no change since last visit    Significant OB History: no change since last visit      Current Outpatient Prescriptions:  fluticasone (FLONASE) 50 MCG/ACT nasal spray 1 spray by Each Nostril route daily Disp: 90 g Rfl: 5   Cholecalciferol (VITAMIN D) 2000 UNIT CAPS Take 1 capsule by mouth daily. Disp: 1 capsule Rfl: 0   PX ENTERIC ASPIRIN 81 MG OR TBEC 1 tablet by mouth daily Disp:  Rfl: 12   ATORVASTATIN CALCIUM 10 MG OR TABS one every other day Disp:  Rfl: 0   OTHER MEDICATION ciprofibrate 100 mg from [place] (a fibric acid)  one every other day Disp:  Rfl: 0     No current facility-administered medications for this visit.     Diclofenac Potassium; Propoxyphene    Patient Active Problem List:     Mitral valve disorders     Calculus of kidney     Pure hypercholesterolemia     Family history of diabetes mellitus     Postmenopausal atrophic vaginitis     Generalized osteoarthrosis, involving hand     Allergic rhinitis due to pollen     Atypical chest pain     Vitamin D deficiency     Hyperopia     Blepharitis     Elevated hemoglobin A1c     Family history of malignant neoplasm of gastrointestinal tract     Basal cell carcinoma of nose     Right shoulder pain     Osteoporosis      Past Surgical History:  No date: BREAST REDUCTION SURGERY  1995: NIPPLE/AREOLA RECONSTRUCTION      Comment: breast reduction      Social History   Marital status: Single  Spouse name: N/A    Years of education: N/A  Number of children: N/A     Occupational History  translator NONE      Social History Main Topics   Smoking status: Never Smoker    Smokeless tobacco: Never Used    Alcohol use Yes  0.0 oz/week      Comment: less than weekly    Drug use: No    Sexual activity: Not Currently    Partners: Male     Other Topics Concern    Military Service No    Blood Transfusions No    Caffeine Concern No    Occupational Exposure No    Sleep Concern No    Stress Concern No    Weight Concern No    Special Diet No    Exercise Yes    Comment: stopped zumba due to hip pain, plans to restart    Seat Belt Yes    Self-Exams Yes     Social History Narrative    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      Family History    Heart Father     Comment: MI age 26    Osteoporosis Mother     Asthma Sister     Diabetes Brother     Theatre stage manager Abuse Brother     Cataracts Father     Hypertension Sister     Hypertension Brother     Diabetes Brother     Cancer - Breast FamHxNeg     Cancer - Colon Sister     Diabetes Sister     Comment: sister required aortive valve replacement       ROS:  Constitutional: benign  Allergy: none  Endocrine: negative  Gastrointestinal: negative  Genitourinary: PMB  Psychiatric: negative    PHYSICAL EXAM:  Constitutional: well developed, well nourished [place] female  Skin: clear  Thyroid: not enlarged  Breasts: no masses, skin, nipple or axillary changes  Abdomen: no masses or tenderness    PELVIC:  External Genitalia: normal architecture- atrophic changes  Urethral Meatus: normal size and location, without lesions or prolapse  Urethra: without masses, tenderness or scarring  Bladder: without fullness, masses or tenderness  Vagina: thin-walled/atrophic and no lesions  Vaginal Discharge: normal appearing  Pelvic supports: normal  Cervix: no lesions  Uterus: anteverted, normal size and non-tender  Adnexa: no masses, nodularity, tenderness      Anus and Perineum: normal  Rectum: deferred      ASSESSMENT & PLAN:  Well GYN annual  PMB    COUNSELING:  diet and  nutrition, calcium supplements, exercise, breast self awareness   ultrasound to check endometrium

## 2015-09-26 LAB — HUMAN PAPILLOMAVIRUS (HPV): HUMAN PAPILLOMAVIRUS: NEGATIVE

## 2015-09-29 LAB — CYTOPATH, C/V, THIN LAYER

## 2015-10-10 ENCOUNTER — Encounter (HOSPITAL_BASED_OUTPATIENT_CLINIC_OR_DEPARTMENT_OTHER): Payer: Self-pay | Admitting: Obstetrics & Gynecology

## 2015-10-14 ENCOUNTER — Ambulatory Visit: Payer: Self-pay | Admitting: Obstetrics & Gynecology

## 2015-10-14 DIAGNOSIS — N950 Postmenopausal bleeding: Principal | ICD-10-CM

## 2015-10-14 LAB — US PELVIC NON-OB W TRANSVAG, 3D

## 2015-10-14 NOTE — Addendum Note (Signed)
Addended by: Evette Georges on: 10/14/2015 11:55 AM     Modules accepted: Orders

## 2015-10-16 ENCOUNTER — Telehealth (HOSPITAL_BASED_OUTPATIENT_CLINIC_OR_DEPARTMENT_OTHER): Payer: Self-pay | Admitting: Registered Nurse

## 2015-10-16 NOTE — Progress Notes (Signed)
Incoming call from pt. She is asking for lab and U/S results. Relayed that pap and HPV normal - U/S unremarkable, endometrial stripe normal for post menopausal woman. Told pt that MD not in clinic, I will forward to MD for review, if any additional response I will call pt back.

## 2015-11-17 ENCOUNTER — Other Ambulatory Visit (HOSPITAL_BASED_OUTPATIENT_CLINIC_OR_DEPARTMENT_OTHER): Payer: Self-pay | Admitting: Internal Medicine

## 2015-11-17 MED ORDER — ATORVASTATIN CALCIUM 10 MG PO TABS
10.0000 mg | ORAL_TABLET | ORAL | 5 refills | Status: AC
Start: 2015-11-17 — End: 2016-11-16

## 2015-11-17 MED ORDER — VITAMIN D 50 MCG (2000 UT) PO CAPS
1.0000 | ORAL_CAPSULE | Freq: Every day | ORAL | 5 refills | Status: AC
Start: 2015-11-17 — End: 2016-11-16

## 2015-11-17 NOTE — Progress Notes (Signed)
Person calling on behalf of patient: Patient (self)    Linda Rodriguez is a 65 year old female       - medication(s) request:    - Atorvastatin 10 mg   - Vitamin D 2000 units    Please note: Both medications are listed as historical in the patient's profile.     - last office visit: 08/07/2015 with Dr. Broadus John  - last physical exam: 09/25/2015      Other Med Adult:  Most Recent BP Reading(s)  09/25/15 : 130/80          Cholesterol (mg/dL)   Date Value   08/07/2015 197   ----------    LOW DENSITY LIPOPROTEIN DIRECT (mg/dL)   Date Value   08/07/2015 107   ----------    HIGH DENSITY LIPOPROTEIN (mg/dL)   Date Value   08/07/2015 61   ----------    TRIGLYCERIDES (mg/dL)   Date Value   08/07/2015 191 (H)   ----------        THYROID SCREEN TSH REFLEX FT4 (uIU/mL)   Date Value   08/07/2015 1.660   ----------      No results found for: TSH      HEMOGLOBIN A1C (%)   Date Value   08/07/2015 5.9 (H)   ----------        INR (no units)   Date Value   10/23/2008 1.0 (L)   07/14/2001 0.9 (L)   ----------      SODIUM (mmol/L)   Date Value   08/07/2015 141   ----------      POTASSIUM (mmol/L)   Date Value   08/07/2015 4.8   ----------          CREATININE (mg/dL)   Date Value   08/07/2015 0.8   ----------          Documented patient preferred pharmacies:    [facility] - [address], [place] - [place]  Phone: [phone] Fax: [phone]    [facility] - [address], [place] - [address]  Phone: [phone] Fax: [phone]    [facility]  Phone: [phone] Fax: [phone]

## 2015-12-09 ENCOUNTER — Ambulatory Visit (HOSPITAL_BASED_OUTPATIENT_CLINIC_OR_DEPARTMENT_OTHER): Payer: No Typology Code available for payment source | Admitting: Internal Medicine

## 2015-12-09 ENCOUNTER — Encounter (HOSPITAL_BASED_OUTPATIENT_CLINIC_OR_DEPARTMENT_OTHER): Payer: Self-pay | Admitting: Internal Medicine

## 2015-12-09 VITALS — BP 158/68 | HR 80 | Temp 98.3°F | Wt 112.8 lb

## 2015-12-09 DIAGNOSIS — Z1211 Encounter for screening for malignant neoplasm of colon: Secondary | ICD-10-CM

## 2015-12-09 DIAGNOSIS — I10 Essential (primary) hypertension: Principal | ICD-10-CM

## 2015-12-09 DIAGNOSIS — E785 Hyperlipidemia, unspecified: Secondary | ICD-10-CM

## 2015-12-09 DIAGNOSIS — K219 Gastro-esophageal reflux disease without esophagitis: Secondary | ICD-10-CM

## 2015-12-09 MED ORDER — BLOOD PRESSURE KIT
1.00 | PACK | 0 refills | Status: AC | PRN
Start: 2015-12-09 — End: 2016-12-09

## 2015-12-09 MED ORDER — FENOFIBRATE 160 MG PO TABS
160.00 mg | ORAL_TABLET | Freq: Every day | ORAL | 5 refills | Status: AC
Start: 2015-12-09 — End: 2016-03-12

## 2015-12-09 MED ORDER — FAMOTIDINE 20 MG PO TABS
20.00 mg | ORAL_TABLET | Freq: Two times a day (BID) | ORAL | 5 refills | Status: AC | PRN
Start: 2015-12-09 — End: 2016-06-06

## 2015-12-09 MED ORDER — BLOOD PRESSURE KIT: 1 | kit | 0 refills | 0 days | Status: AC | PRN

## 2015-12-09 MED ORDER — FENOFIBRATE 160 MG PO TABS: 160 mg | tablet | Freq: Every day | ORAL | 5 refills | 0 days | Status: AC

## 2015-12-09 MED ORDER — HYDROCHLOROTHIAZIDE 12.5 MG PO CAPS: 13 mg | capsule | Freq: Every day | ORAL | 2 refills | 0 days | Status: DC

## 2015-12-09 MED ORDER — HYDROCHLOROTHIAZIDE 12.5 MG PO CAPS
12.5000 mg | ORAL_CAPSULE | Freq: Every day | ORAL | 2 refills | Status: DC
Start: 2015-12-09 — End: 2016-02-24

## 2015-12-09 MED ORDER — FAMOTIDINE 20 MG PO TABS: 20 mg | tablet | Freq: Two times a day (BID) | ORAL | 5 refills | 0 days | Status: AC | PRN

## 2015-12-09 MED FILL — FENOFIBRATE 160MG: 30 days supply | Qty: 30 | Fill #0 | Status: CP

## 2015-12-09 MED FILL — *HYDROCHLOROT  12.5MG CAP: 30 days supply | Qty: 30 | Fill #0 | Status: CP

## 2015-12-09 MED FILL — *FAMOTIDINE  20MG: 30 days supply | Qty: 60 | Fill #0 | Status: CP

## 2015-12-09 NOTE — Progress Notes (Signed)
[place] VISIT NOTE    Linda Rodriguez is a 65 year old female presenting for clinic visit.    Elevated BP  Reports she is stressed out due to moving soon   Feels she is behind on things she needs to do   Feels her elevated BP is due to situational stress   Father died of heart attack   Sister with hypertension   Requested an order for BP cuff       Hyperlipidemia  Is taking atorvastatin 10 mg       Review of past medical/surgical/social/allergy/medication history as noted below:    Current Outpatient Prescriptions on File Prior to Visit:  atorvastatin (LIPITOR) 10 MG tablet Take 1 tablet by mouth every other day Disp: 30 tablet Rfl: 5   Cholecalciferol (VITAMIN D) 2000 units CAPS Take 1 capsule by mouth daily Disp: 30 capsule Rfl: 5   fluticasone (FLONASE) 50 MCG/ACT nasal spray 1 spray by Each Nostril route daily Disp: 90 g Rfl: 5   PX ENTERIC ASPIRIN 81 MG OR TBEC 1 tablet by mouth daily Disp:  Rfl: 12     No current facility-administered medications on file prior to visit.     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 Lorelle Formosa "ECC               inadequate but not suspicious clinically for                abnormality; follow one year pap as planned."                3/09 Pap insufficient cells, HPV negative 9/09                Pap normal HPV positive repeat colpo after                topical estrogens to facilitate ECC normal Pap                11/2008 WNL HPV negative, return to routine                screening 03/2010@ patient request HPV negative,               Pap no transformation zone, no rep  05/17/2011: Elevated blood pressure reading without diagno*      Comment:  Most Recent BP Reading(s) 04/05/12 : 120/68                08/20/11 : 120/60 08/18/11 : 103/57    Patient                reports February BP readings 110/54, 114/70 and               states that since her life has  become less                stressful, her BP decreased.   Most Recent BP                Reading(s) 01/27/11 : 148/72 04/08/10 : 118/60                05/02/09 : 112/64 11/20/08 : 110/68 10/30/08 :                112/60    08/10/2004: HYPERLIPIDEMIA NEC/NOS  Comment: LDL 194  08/10/2004: menopause 2001  08/10/2004: MITRAL VALVE DISORDER      Comment: History of rheumatic fever Echo 9/01 mild                mitral regurgitation  04/29/2009: Presbyopia    Past Surgical History:  No date: BREAST REDUCTION SURGERY  1995: NIPPLE/AREOLA RECONSTRUCTION      Comment: breast reduction    Review of Patient's Allergies indicates:   Diclofenac potassium    Itching   Propoxyphene            Hives    Comment:After taking a propoxyphene/ASA combination             Tolerates Exedrin      Social History   Marital status: Single  Spouse name: N/A    Years of education: N/A  Number of children: N/A     Occupational History  translator NONE      Social History Main Topics   Smoking status: Never Smoker    Smokeless tobacco: Never Used    Alcohol use Yes  0.0 oz/week     Comment: less than weekly    Drug use: No    Sexual activity: Not Currently    Partners: Male     Other Topics Concern    Military Service No    Blood Transfusions No    Caffeine Concern No    Occupational Exposure No    Sleep Concern No    Stress Concern No    Weight Concern No    Special Diet No    Exercise Yes    Comment: stopped zumba due to hip pain, plans to restart    Seat Belt Yes    Self-Exams Yes     Social History Narrative    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    Neurological: Negative.    Psychiatric/Behavioral: Negative.          PHYSICAL EXAMINATION  BP 158/68   Pulse 80   Temp 98.3 F (36.8 C) (Temporal)   Wt 51.2 kg (112 lb 12.8 oz)   SpO2 98%   BMI 21.31  kg/m2    Physical Exam   Constitutional: She is oriented to person, place, and time. She appears well-developed and well-nourished. No distress.   HENT:   Head: Normocephalic and atraumatic.   Eyes: Conjunctivae are normal.   Neck: Neck supple.   Neurological: She is alert and oriented to person, place, and time.   Skin: Skin is warm.   Psychiatric: She has a normal mood and affect. Her behavior is normal.   Vitals reviewed.        ASSESSMENT/PLAN    Essential hypertension  (primary encounter diagnosis)  Dyslipidemia  Gastroesophageal reflux disease without esophagitis  Screening for colon cancer      BP elevated with initial and repeat. Start treatment with HCTZ - given likely diagnosis of essential HTN. Counseled to take as instructed. Counseled to obtain BP cuff and begin taking measurements at home.   Plan: Blood Pressure KIT, hydrochlorothiazide (MICROZIDE) 12.5 MG capsule  Patient to continue current anti-hypertensive regimen    (E78.5) Dyslipidemia  Comment:  Elevate triglyceride levels 191 per previous lab on 05/17  Plan: fenofibrate (TRIGLIDE) 160 MG tablet  Will obtain lipid panel at future visit    (K21.9) Gastroesophageal reflux disease without esophagitis  Comment: Discussed - started treatment with Pepcid 20 mg daily   Plan: famotidine (PEPCID) 20 MG tablet    (Z12.11) Screening for colon cancer  Comment: Discussed colon cancer screening - prior colonoscopy noted - has elected to do  screening - patient educated regarding   Plan: [place] COLONOSCOPY      We discussed the patients current medications. The patient expressed understanding and no barriers to adherence were identified.   1. The patient indicates understanding of these issues and agrees with the plan. Brief care plan is updated and reviewed with the patient.   2. The patient is given an After Visit Summary sheet that lists all medications with directions, allergies, orders placed during this encounter, and follow-up  instructions.   3. I reviewed the patient's medical information and medical history   4. I reconciled the patient's medication list and prepared and supplied needed refills.   5. I have reviewed the past medical, family, and social history sections including the medications and allergies.      By signing my name below, I, Georgeanna Lea, attest that this documentation has been prepared under the direction and in the presence of Gordy Levan, M.D.  Electronically signed: Georgeanna Lea, Medical Scribe. 12/09/15 7:43 PM    I, [name], personally performed the services described in this documentation. All medical record entries made by the scribe were at my direction and in my presence. I have reviewed the chart and discharge instructions (if applicable) and agree that the record reflects my personal performance and is accurate and complete.   [name], MD, 12/14/2015, 1:10 AM

## 2015-12-10 ENCOUNTER — Telehealth (HOSPITAL_BASED_OUTPATIENT_CLINIC_OR_DEPARTMENT_OTHER): Payer: Self-pay

## 2015-12-10 NOTE — Progress Notes (Signed)
Unable to reach pt regarding GI procedure. Voice mail left to call [facility] nurses at  [phone] to schedule procedure.

## 2015-12-14 DIAGNOSIS — I10 Essential (primary) hypertension: Principal | ICD-10-CM | POA: Insufficient documentation

## 2015-12-22 MED FILL — *VITAMIN D3 2000UNIT: 30 days supply | Qty: 30 | Fill #1 | Status: CP

## 2015-12-22 MED FILL — ATORVASTATIN 10MG: 15 days supply | Qty: 15 | Fill #1 | Status: CP

## 2015-12-24 ENCOUNTER — Ambulatory Visit (HOSPITAL_BASED_OUTPATIENT_CLINIC_OR_DEPARTMENT_OTHER): Payer: No Typology Code available for payment source | Admitting: Ophthalmology

## 2015-12-24 DIAGNOSIS — H00021 Hordeolum internum right upper eyelid: Principal | ICD-10-CM

## 2015-12-24 NOTE — Nursing Note (Signed)
65 year old / F  -  eyelid lesion - bump - RUL x 4 days     No VA problem CC.    Ocular History:    Cyst, eyelid, left   Hyperopia with presbyopia, bilateral

## 2015-12-24 NOTE — Progress Notes (Signed)
Linda Rodriguez was seen at the [facility] for evaluation of a red swollen upper eyelid OD.    On evaluation she had mild erythema and soft tissue swelling of the upper lid of the right eye. She had a suggestion of a localized area of swelling. The lid changes most likely due to a hordeolum.    She was advised to apply warm compresses 4 times a day being careful not to burn herself.    She'll return to clinic if the swelling doesn't resolve over the next 7-10 days.

## 2016-01-01 ENCOUNTER — Ambulatory Visit (HOSPITAL_BASED_OUTPATIENT_CLINIC_OR_DEPARTMENT_OTHER): Payer: No Typology Code available for payment source | Admitting: Internal Medicine

## 2016-01-07 ENCOUNTER — Encounter (HOSPITAL_BASED_OUTPATIENT_CLINIC_OR_DEPARTMENT_OTHER): Payer: Self-pay | Admitting: Internal Medicine

## 2016-01-07 DIAGNOSIS — J301 Allergic rhinitis due to pollen: Principal | ICD-10-CM

## 2016-01-07 NOTE — Telephone Encounter (Signed)
Message from [place]:  Linda Rodriguez would like a refill of the following medications:  fluticasone (FLONASE) 50 MCG/ACT nasal spray [name], MD]    Preferred pharmacy: [facility]    Comment:  Hi doctor [name], could you please send to [place] a prescription for Fluticasone 51mcg? Thanks so much! [name]

## 2016-01-09 MED FILL — *FAMOTIDINE  20MG: 30 days supply | Qty: 60 | Fill #1 | Status: CP

## 2016-01-09 MED FILL — *HYDROCHLOROT  12.5MG CAP: 30 days supply | Qty: 30 | Fill #1 | Status: CP

## 2016-01-09 MED FILL — FENOFIBRATE 160MG: 30 days supply | Qty: 30 | Fill #1 | Status: CP

## 2016-01-09 MED FILL — *FLUTICASONE SPR 50MCG: 30 days supply | Qty: 16 | Fill #1 | Status: CP

## 2016-01-09 MED FILL — *ATORVASTATIN 10MG: 15 days supply | Qty: 15 | Fill #2 | Status: CP

## 2016-01-09 MED FILL — *VITAMIN D3 2000UNIT: 30 days supply | Qty: 30 | Fill #2 | Status: CP

## 2016-01-29 ENCOUNTER — Encounter (HOSPITAL_BASED_OUTPATIENT_CLINIC_OR_DEPARTMENT_OTHER): Payer: Self-pay | Admitting: Internal Medicine

## 2016-01-29 DIAGNOSIS — L989 Disorder of the skin and subcutaneous tissue, unspecified: Principal | ICD-10-CM

## 2016-02-02 ENCOUNTER — Encounter (HOSPITAL_BASED_OUTPATIENT_CLINIC_OR_DEPARTMENT_OTHER): Payer: Self-pay | Admitting: Internal Medicine

## 2016-02-02 DIAGNOSIS — I10 Essential (primary) hypertension: Principal | ICD-10-CM

## 2016-02-03 ENCOUNTER — Other Ambulatory Visit (HOSPITAL_BASED_OUTPATIENT_CLINIC_OR_DEPARTMENT_OTHER): Payer: Self-pay | Admitting: Internal Medicine

## 2016-02-03 DIAGNOSIS — M159 Polyosteoarthritis, unspecified: Principal | ICD-10-CM

## 2016-02-03 MED FILL — *HYDROCHLOROT 12.5MG CAPS: 30 days supply | Qty: 30 | Fill #2 | Status: CP

## 2016-02-03 NOTE — Telephone Encounter (Signed)
Message from [place]:  Tariah T. Saks would like a refill of the following medications:  hydrochlorothiazide (MICROZIDE) 12.5 MG capsule [name], MD]    Preferred pharmacy: [facility]    Comment:

## 2016-02-03 NOTE — Telephone Encounter (Signed)
Med on file with Pharmacy:     confirmed that HCTZ has active refills remaining. No refill is required at this time.

## 2016-02-07 MED FILL — FENOFIBRATE 160MG: 30 days supply | Qty: 30 | Fill #2 | Status: CP

## 2016-02-07 MED FILL — ATORVASTATIN 10MG: 15 days supply | Qty: 15 | Fill #3 | Status: CP

## 2016-02-07 MED FILL — *VITAMIN D3 2000UNIT: 30 days supply | Qty: 30 | Fill #3 | Status: CP

## 2016-02-11 ENCOUNTER — Encounter (HOSPITAL_BASED_OUTPATIENT_CLINIC_OR_DEPARTMENT_OTHER): Payer: Self-pay

## 2016-02-13 ENCOUNTER — Ambulatory Visit (HOSPITAL_BASED_OUTPATIENT_CLINIC_OR_DEPARTMENT_OTHER): Payer: No Typology Code available for payment source

## 2016-02-13 DIAGNOSIS — E7800 Pure hypercholesterolemia, unspecified: Principal | ICD-10-CM

## 2016-02-13 DIAGNOSIS — R7309 Other abnormal glucose: Secondary | ICD-10-CM

## 2016-02-13 LAB — LIPID PANEL
Cholesterol: 173 mg/dL (ref 0–239)
HIGH DENSITY LIPOPROTEIN: 81 mg/dL (ref 40–?)
LOW DENSITY LIPOPROTEIN DIRECT: 80 mg/dL (ref 0–189)
TRIGLYCERIDES: 133 mg/dL (ref 0–150)

## 2016-02-13 LAB — COMPREHENSIVE METABOLIC PANEL
ALANINE AMINOTRANSFERASE: 30 U/L (ref 12–45)
ALBUMIN: 3.9 g/dL (ref 3.4–5.0)
ALKALINE PHOSPHATASE: 65 U/L (ref 45–117)
ANION GAP: 9 mmol/L (ref 5–15)
ASPARTATE AMINOTRANSFERASE: 27 U/L (ref 8–34)
BILIRUBIN TOTAL: 0.3 mg/dL (ref 0.2–1.0)
BUN (UREA NITROGEN): 19 mg/dL — ABNORMAL HIGH (ref 7–18)
CALCIUM: 9.2 mg/dL (ref 8.5–10.1)
CARBON DIOXIDE: 29 mmol/L (ref 21–32)
CHLORIDE: 103 mmol/L (ref 98–107)
CREATININE: 1 mg/dL (ref 0.4–1.2)
ESTIMATED GLOMERULAR FILT RATE: 55 mL/min — ABNORMAL LOW (ref >60)
Glucose Random: 96 mg/dL (ref 74–160)
POTASSIUM: 5.1 mmol/L (ref 3.5–5.1)
SODIUM: 141 mmol/L (ref 136–145)
TOTAL PROTEIN: 7.1 g/dL (ref 6.4–8.2)

## 2016-02-13 NOTE — Progress Notes (Signed)
Labs drawn

## 2016-02-16 LAB — HEMOGLOBIN A1C
ESTIMATED AVERAGE GLUCOSE: 120 (ref 74–160)
HEMOGLOBIN A1C: 5.8 % — ABNORMAL HIGH (ref 4.0–5.6)

## 2016-02-18 ENCOUNTER — Encounter (HOSPITAL_BASED_OUTPATIENT_CLINIC_OR_DEPARTMENT_OTHER): Payer: Self-pay | Admitting: Internal Medicine

## 2016-02-24 ENCOUNTER — Ambulatory Visit (HOSPITAL_BASED_OUTPATIENT_CLINIC_OR_DEPARTMENT_OTHER): Payer: No Typology Code available for payment source | Admitting: Internal Medicine

## 2016-02-24 ENCOUNTER — Encounter (HOSPITAL_BASED_OUTPATIENT_CLINIC_OR_DEPARTMENT_OTHER): Payer: Self-pay | Admitting: Internal Medicine

## 2016-02-24 VITALS — BP 138/73 | HR 91 | Temp 97.1°F | Wt 112.4 lb

## 2016-02-24 DIAGNOSIS — I10 Essential (primary) hypertension: Secondary | ICD-10-CM

## 2016-02-24 DIAGNOSIS — M25572 Pain in left ankle and joints of left foot: Principal | ICD-10-CM

## 2016-02-24 MED ORDER — HYDROCHLOROTHIAZIDE 12.5 MG PO CAPS
12.5000 mg | ORAL_CAPSULE | Freq: Every day | ORAL | 1 refills | Status: AC
Start: 2016-02-24 — End: 2016-08-22

## 2016-02-24 MED ORDER — HYDROCHLOROTHIAZIDE 12.5 MG PO CAPS: 13 mg | capsule | Freq: Every day | ORAL | 1 refills | 0 days | Status: AC

## 2016-02-24 NOTE — Progress Notes (Signed)
[place] VISIT NOTE    Linda Rodriguez is a 65 year old female presenting for clinic visit.    Left Foot Pain  Complains of swelling that occurred in left foot after day of shopping   Pain increased from 2/10 to 8/10   Has pain with bearing weight on foot   No history of ankle injuries     HTN   Is taking microzide 12.5 mg daily     Review of past medical/surgical/social/allergy/medication history as noted below:    Current Outpatient Prescriptions on File Prior to Visit:  [DISCONTINUED] hydrochlorothiazide (MICROZIDE) 12.5 MG capsule Take 1 capsule by mouth daily Disp: 30 capsule Rfl: 2   fenofibrate (TRIGLIDE) 160 MG tablet Take 1 tablet by mouth daily Disp: 30 tablet Rfl: 5   famotidine (PEPCID) 20 MG tablet Take 1 tablet by mouth 2 (two) times daily as needed Disp: 60 tablet Rfl: 5   Blood Pressure KIT Use 1 Device As Directed as needed Disp: 1 kit Rfl: 0   atorvastatin (LIPITOR) 10 MG tablet Take 1 tablet by mouth every other day Disp: 30 tablet Rfl: 5   Cholecalciferol (VITAMIN D) 2000 units CAPS Take 1 capsule by mouth daily Disp: 30 capsule Rfl: 5   fluticasone (FLONASE) 50 MCG/ACT nasal spray 1 spray by Each Nostril route daily Disp: 90 g Rfl: 5   PX ENTERIC ASPIRIN 81 MG OR TBEC 1 tablet by mouth daily Disp:  Rfl: 12     No current facility-administered medications on file prior to visit.     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 Lorelle Formosa "ECC               inadequate but not suspicious clinically for                abnormality; follow one year pap as planned."                3/09 Pap insufficient cells, HPV negative 9/09                Pap normal HPV positive repeat colpo after                topical estrogens to facilitate ECC normal Pap                11/2008 WNL HPV negative, return to routine                screening 03/2010@ patient request HPV negative,               Pap  no transformation zone, no rep  05/17/2011: Elevated blood pressure reading without diagno*      Comment:  Most Recent BP Reading(s) 04/05/12 : 120/68                08/20/11 : 120/60 08/18/11 : 103/57    Patient                reports February BP readings 110/54, 114/70 and               states that since her life has become less                stressful, her BP decreased.   Most Recent BP  Reading(s) 01/27/11 : 148/72 04/08/10 : 118/60                05/02/09 : 112/64 11/20/08 : 110/68 10/30/08 :                112/60    08/10/2004: HYPERLIPIDEMIA NEC/NOS      Comment: LDL 194  08/10/2004: menopause 2001  08/10/2004: MITRAL VALVE DISORDER      Comment: History of rheumatic fever Echo 9/01 mild                mitral regurgitation  04/29/2009: Presbyopia    Past Surgical History:  No date: BREAST REDUCTION SURGERY  1995: NIPPLE/AREOLA RECONSTRUCTION      Comment: breast reduction    Review of Patient's Allergies indicates:   Diclofenac potassium    Itching   Propoxyphene            Hives    Comment:After taking a propoxyphene/ASA combination             Tolerates Exedrin      Social History  Social History   Marital status: Single  Spouse name: N/A    Years of education: N/A  Number of children: N/A     Occupational History  translator NONE      Social History Main Topics   Smoking status: Never Smoker    Smokeless tobacco: Never Used    Alcohol use Yes  0.0 oz/week     Comment: less than weekly    Drug use: No    Sexual activity: Not Currently    Partners: Male     Other Topics Concern    Military Service No    Blood Transfusions No    Caffeine Concern No    Occupational Exposure No    Sleep Concern No    Stress Concern No    Weight Concern No    Special Diet No    Exercise Yes    Comment: stopped zumba due to hip pain, plans to restart    Seat Belt Yes    Self-Exams Yes     Social History Narrative    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    Neurological: Negative.    Psychiatric/Behavioral: Negative.        PHYSICAL EXAMINATION  BP 138/73  Pulse 91  Temp 97.1 F (36.2 C) (Temporal)  Wt 51 kg (112 lb 6.4 oz)  SpO2 98%  BMI 21.24 kg/m2    Physical Exam   Constitutional: She is oriented to person, place, and time. She appears well-developed and well-nourished. No distress.   HENT:   Head: Normocephalic and atraumatic.   Eyes: Conjunctivae are normal.   Neck: Neck supple.   Musculoskeletal: She exhibits no deformity.   Tenderness of left mid-foot    Neurological: She is alert and oriented to person, place, and time.   Skin: Skin is warm.   Psychiatric: She has a normal mood and affect. Her behavior is normal.   Vitals reviewed.        ASSESSMENT/PLAN    Acute left ankle pain  (primary encounter diagnosis)  Essential hypertension    (M25.572)  Acute left ankle pain  (primary encounter diagnosis)  Comment: Pain likely due to ankle sprain - reassurance provided to patient.   Plan: Handout given for foot exercises - counseled to complete exercises as tolerable and to stop if experiencing any pain. Instructed to start exercises after swelling has reduced.    (I10) Essential hypertension  Comment: BP controlled presently   Plan: hydrochlorothiazide (MICROZIDE) 12.5 MG capsule  Patient to continue current anti-hypertensive regimen      We discussed the patients current medications. The patient expressed understanding and no barriers to adherence were identified.   1. The patient indicates understanding of these issues and agrees with the plan. Brief care plan is updated and reviewed with the patient.   2. The patient is given an After Visit Summary sheet that lists all medications with directions, allergies, orders placed during this encounter, and follow-up instructions.   3. I reviewed the patient's medical information and medical  history   4. I reconciled the patient's medication list and prepared and supplied needed refills.   5. I have reviewed the past medical, family, and social history sections including the medications and allergies.      By signing my name below, I, Georgeanna Lea, attest that this documentation has been prepared under the direction and in the presence of Gordy Levan, M.D.  Electronically signed: Georgeanna Lea, Medical Scribe. 02/24/16 1:29 PM    I, [name], personally performed the services described in this documentation. All medical record entries made by the scribe were at my direction and in my presence. I have reviewed the chart and discharge instructions (if applicable) and agree that the record reflects my personal performance and is accurate and complete.   [name], MD, 02/27/2016, 6:07 AM

## 2016-02-24 NOTE — Patient Instructions (Signed)
Patient Education   Patient Education   Patient Education                Index Illustration Illustration Illustration         Ankle Sprain Exercises   As soon as you can tolerate pressure on the ball of your foot, begin stretching your ankle using the towel stretch. When this stretch is easy, try the other exercises.   Towel stretch: Sit on a hard surface with your injured leg stretched out in front of you. Loop a towel around your toes and the ball of your foot and pull the towel toward your body keeping your leg straight. Hold this position for 15 to 30 seconds and then relax. Repeat 3 times.    Standing calf stretch: Stand facing a wall with your hands on the wall at about eye level. Keep your injured leg back with your heel on the floor. Keep the other leg forward with the knee bent. Turn your back foot slightly inward (as if you were pigeon-toed). Slowly lean into the wall until you feel a stretch in the back of your calf. Hold the stretch for 15 to 30 seconds. Return to the starting position. Repeat 3 times. Do this exercise several times each day.    Standing soleus stretch: Stand facing a wall with your hands on the wall at about chest height. Keep your injured leg back with your heel on the floor. Keep the other leg forward with the knee bent. Turn your back foot slightly inward (as if you were pigeon-toed). Bend your back knee slightly and gently lean into the wall until you feel a stretch in the lower calf of your injured leg. Hold the stretch for 15 to 30 seconds. Return to the starting position. Repeat 3 times.    Ankle range of motion: Sit or lie down with your legs straight and your knees pointing toward the ceiling. Point your toes on your injured side toward your nose, then away from your body. Point your toes in toward your other foot and then out away from your other foot. Finally, move the top of your foot in circles. Move only your foot and ankle. Don't move your leg. Repeat 10 times in each  direction. Push hard in all directions.    Resisted ankle dorsiflexion: Tie a knot in one end of the elastic tubing and shut the knot in a door. Tie a loop in the other end of the tubing and put the foot on your injured side through the loop so that the tubing goes around the top of the foot. Sit facing the door with your injured leg straight out in front of you. Move away from the door until there is tension in the tubing. Keeping your leg straight, pull the top of your foot toward your body, stretching the tubing. Slowly return to the starting position. Do 2 sets of 15.    Resisted ankle plantar flexion: Sit with your injured leg stretched out in front of you. Loop the tubing around the ball of your foot. Hold the ends of the tubing with both hands. Gently press the ball of your foot down and point your toes, stretching the tubing. Return to the starting position. Do 2 sets of 15.    Resisted ankle inversion: Sit with your legs stretched out in front of you. Cross the ankle of your uninjured leg over your other ankle. Wrap elastic tubing around the ball of the foot of your  injured leg and then loop it around your other foot so that the tubing is anchored there at one end. Hold the other end of the tubing in your hand. Turn the foot of your injured leg inward and upward. This will stretch the tubing. Return to the starting position. Do 2 sets of 15.    Resisted ankle eversion: Sit with both legs stretched out in front of you, with your feet about a shoulder's width apart. Tie a loop in one end of elastic tubing. Put the foot of your injured leg through the loop so that the tubing goes around the arch of that foot and wraps around the outside of the other foot. Hold onto the other end of the tubing with your hand to provide tension. Turn the foot of your injured leg up and out. Make sure you keep your other foot still so that it will allow the tubing to stretch as you move the foot of your injured leg. Return to  the starting position. Do 2 sets of 15.   You may do the following exercises when you can stand on your injured ankle without pain.   Heel raise: Balance yourself while standing behind a chair or counter. Using the chair or counter as a support to help you, raise your body up onto your toes and hold for 5 seconds. Then slowly lower yourself down without holding onto the support. (It's OK to keep holding onto the support if you need to.) When this exercise becomes less painful, try lowering yourself down on the injured leg only. Repeat 15 times. Do 2 sets of 15. Rest 30 seconds between sets.    Step-up: Stand with the foot of your injured leg on a support 3 to 5 inches high (like a small step or block of wood). Keep your other foot flat on the floor. Shift your weight onto the injured leg on the support. Straighten your injured leg as the other leg comes off the floor. Return to the starting position by bending your injured leg and slowly lowering your uninjured leg back to the floor. Do 2 sets of 15.    Balance and reach exercises: Stand next to a chair with your injured leg farther from the chair. The chair will provide support if you need it. Stand on the foot of your injured leg and bend your knee slightly. Try to raise the arch of this foot while keeping your big toe on the floor.  1. Keep your foot in this position. With the hand that is farther away from the chair, reach forward in front of you by bending at the waist. Avoid bending your knee any more as you do this. Repeat this 10 times. To make the exercise more challenging, reach farther in front of you. Do 2 sets of 10.  2. Stand in the same position as above. While keeping your arch height, reach the hand that is farther away from the chair across your body toward the chair. The farther you reach, the more challenging the exercise. Do 2 sets of 10.   Jump rope: Jump rope landing on both legs for 5 minutes. Then jump landing on just 1 leg at a time for  5 minutes.   If you have access to a wobble board, do the following exercises:   Wobble board exercises:  1. Stand on a wobble board with your feet shoulder width apart. Rock the board forwards and backwards 30 times, then side to side 30  times. Hold on to a chair if you need support.  2. Rotate the wobble board around so that the edge of the board is in contact with the floor at all times. Do this 30 times in a clockwise and then a counterclockwise direction.  3. Balance on the wobble board for as long as you can without letting the edges touch the floor. Try to do this for 2 minutes without touching the floor.  4. Rotate the wobble board in clockwise and counterclockwise circles, but do not let the edge of the board touch the floor.  5. When you have mastered exercises A through D, try repeating them while standing on just your injured leg.  After you are able to do these exercises on one leg, try to do them with your eyes closed. Make sure you have something nearby to support you in case you lose your balance.  Written by Reginia Naas, MS, PT, and Rachael Fee, PT, DHSc, OCS, for [employer].  Adult Advisor 2012.1 published by [employer].  Last modified: 2009-07-18  Last reviewed: 2008-08-15  This content is reviewed periodically and is subject to change as new health information becomes available. The information is intended to inform and educate and is not a replacement for medical evaluation, advice, diagnosis or treatment by a healthcare professional.  Adult Advisor 2012.1 Index    2012 [employer] and/or its affiliates. All rights reserved.

## 2016-03-08 MED FILL — *HYDROCHLOROT  12.5MG: 90 days supply | Qty: 90 | Fill #0 | Status: CP

## 2016-03-09 ENCOUNTER — Telehealth (HOSPITAL_BASED_OUTPATIENT_CLINIC_OR_DEPARTMENT_OTHER): Payer: Self-pay

## 2016-03-09 MED FILL — FAMOTIDINE 20MG: 30 days supply | Qty: 60 | Fill #0 | Status: CP

## 2016-03-09 MED FILL — VITAMIN D3 2000UNIT: 30 days supply | Qty: 30 | Fill #0 | Status: CP

## 2016-03-09 MED FILL — *ATORVASTATIN 10MG: 15 days supply | Qty: 15 | Fill #0 | Status: CP

## 2016-03-09 MED FILL — FENOFIBRATE 160MG: 30 days supply | Qty: 30 | Fill #0 | Status: CP

## 2016-03-09 NOTE — Progress Notes (Signed)
Called patient to discuss scheduling a colonoscopy for a history polyps and  + F.H. Colon cancer. Unable to reach patient.Left a VM message with my contact information and call back # of [phone].    Have been unable to contact patient to schedule a colonoscopy despite  phone calls and a letter. We are removing patient from our active call list. Will ask PCP to discuss with patient and request they call [phone] to schedule.

## 2016-03-12 ENCOUNTER — Telehealth (HOSPITAL_BASED_OUTPATIENT_CLINIC_OR_DEPARTMENT_OTHER): Payer: Self-pay

## 2016-03-12 ENCOUNTER — Ambulatory Visit (HOSPITAL_BASED_OUTPATIENT_CLINIC_OR_DEPARTMENT_OTHER): Payer: No Typology Code available for payment source | Admitting: Internal Medicine

## 2016-03-12 NOTE — Progress Notes (Signed)
Spoke with patient regarding scheduling colonoscopy. Procedure , preparation, need for ride home explained to pt . Assessment completed. Scheduled for colonoscopy for +FH with Dr Mamie Levers and moderate sedation at [place] on 04/07/16

## 2016-03-12 NOTE — Patient Instructions (Signed)
Miralax and Dulcolax Bowel Prep     Division of Gastroenterology  [facility]  Phone:  Address: [address] [place], [place] [address]    PLEASE READ CAREFULLY OR HAVE SOMEONE READ THIS TO YOU!    ____________________________________________  Name    Your Colonoscopy test is scheduled at [facility] are having a colonoscopy.  This test lets the doctor see the inside your large intestine (also called your colon) using a small camera on a flexible tube.  There are several reasons to have the test:   Screening for colon cancers and polyps    To check unexplained changes in bowel habits   To evaluate abnormal X-ray findings   To look for bleeding ulcers or other abnormalities of the colon lining                HOW TO GET READY FOR THE TEST    You will need to purchase the following items from a pharmacy       ONE BOTTLE OF MIRALAX (8.3 OUNCE OR 238 GRAMS)   4 DULCOLAX OR BISACODYL TABLETS ( 5 MILLIGRAM TABLETS)   A 64 OUNCE BOTTLE OF SPORTS DRINK SUCH AS GATORADE-IF YOU HAVE DIABETES, BUY A NO OR LOW CAROLIE DRINK SUCH AS CRYSTAL LIGHT INSTEAD. (YOU WILL USE THIS TO MIX YOUR MIRALAX THE DAY BEFORE THE TEST. NO CARBONATED BEVERAGES)      Call a friend or a family member to make sure you have   someone to take you home after your test.   You must have someone to go home with after the test or we will not be able to give you sedatives!    If you have any questions or worries, or if you are unsure about how to prepare for this test, please call                         Day of the test      The morning of the test, you can take your other medications at the usual time with a sip of water. These include blood pressure pills, seizure medications, heart medications, thyroid medications, etc).     Dont take pills for diabetes. Bring these medicines with you so that you can take them right after your test.      NOTHING TO DRINK FOR 3 HOURS BEFORE YOU COME TO HOSPITAL                                 IMPORTANT INFORMATION About Your Medicines  Based On Recommendations From Experts    IF YOU HAVE ANY CONCERNS ABOUT YOUR MEDICATIONS ASK YOUR OWN DOCTOR. DO NOT WAIT UNTIL THE DAY BEFORE THE TEST!!!!!    Your Medications   You can take your medications for high blood pressure, heart problems, or anxiety with a sip of water at your usual time.   Bring a list of your medications with you.           If you take medicines for Type 2 Diabetes:    ? One day before the test: If you are taking diabetes pills: Take PILLS in the morning only. If you take morning insulin: take your usual dose.    ? On the night before the test: Do not take diabetes pills.  If you take insulin  for type 2 diabetes, take  your usual long acting insulin.  For example: if you usually take 40 units of Lantus or NPH, take 20 units instead.    ? On the morning of the test: Do not take diabetes pills. If you are on long-acting insulin for type 2 diabetes, you can take half the dose. For example if you are on 40 units of Lantus or NPH, take 20 units instead.     ? After the test: You will be allowed to eat normally again. At that time, you should resume taking your diabetes pills at your usual times. If on insulin, take your usual evening dose of insulin. If you cant eat for any reason, check with your doctor.    If you have Type 1 Diabetes:    ? One day before the test: Take your usual long-acting basal insulin (Lantus or NPH) and make sure your clear liquid diets contain sugar. Check your blood sugars at meal times and cover with short acting insulin only if blood sugar is over 200. Otherwise do not take your short-acting insulin.    ? On the night before the test: Just take your usual basal insulin dose that you would normally take at that time (for example, your usual full dose of Lantus or NPH).    ? On the morning of the test: Take your usual basal insulin dose that you would normally take at that time (for example, your usual  full dose of Lantus or NPH).    Also note    ? Tylenol (Acetaminophen) is ok for moderate pain relief.     ? If you are on blood thinners (such as Aspirin, Warfarin, Coumadin, Heparin or Plavix) you must ask your doctor or nurse before stopping these medications!!! This important if you take your medicines for heart disease, stroke disease, a pulmonary embolus, artificial heart valves, a heart stent or a vascular stent and any other problems that could lead to a serious clot forming, stroke or heart attack.          Test Checklist  Please use this list to prepare for your test.   -------------------------------------------------------------------------------------------------------------------  7 days before the test      STOP taking Motrin, Advil, Naprosyn, Aleve or related drugs.   Continue to take all other prescribed meds.   Ask your doctor about whether you should continue Aspirin. It is sometimes important to stay on this medication.  -------------------------------------------------------------------------------------------------------------------  3 days before the test      STOP eating any foods that contain beans, seeds, skins, nuts, or are high in fiber.  -------------------------------------------------------------------------------------------------------------------  2 days before the test      MAKE SURE YOU HAVE A RIDE ARRANGED    EAT dinner no later than 7 pm.  BEGIN a liquid diet. (Do not eat solids. This includes foods such as rice ,pasta, bread and fruit).  -------------------------------------------------------------------------------------------------------------------  1 day before the test     PREPARE the laxative.   START taking the Dulcolax tablets at 1 PM.   DRINK the Miralax solution at 4PM  -------------------------------------------------------------------------------------------------------------------  Day of your test                   DO NOT drink anything 3 hours before the  test.    WHAT TO EXPECT ON THE DAY OF YOUR TEST    Colonoscopy    Colonoscopy is a procedure used to see inside the colon and rectum.  During a colonoscopy  a flexible tube with a camera and a light is inserted through the rectum. The doctor then examines the large bowel (also called the large intestine or colon).    Colonoscopies can detect inflamed tissue, ulcers and abnormal growths called polyps. Some polyps are cancerous, but most are pre cancerous. These might become dangerous someday. A doctor can usually remove these polyps during the test. They are then sent to a laboratory to be checked. Polyps are common in adults and are generally harmless. However, most colorectal cancers begin as polyps. Removing them is a good way to prevent cancer. Your doctor may also take biopsies (small pieces of tissue) for analysis in the lab of abnormalities that they want to check in more detail.    It is extremely important to follow all of the steps for cleaning out your colon. If you do not follow all the steps, the doctor may not be able to see clearly. The exam might need to be cancelled and repeated another day. The clear liquids you can take during the clean out are treated as food by your body, so you wont starve or get dehydrated.        Getting Ready At [facility]    On the day of your test, a nurse and doctor will ask you some more information about your health history.  You will be put into a hospital gown. A small intravenous needle will be inserted in the back of your hand or forearm to give you medications that will make you comfortable during the test.    In the procedure room, you will be asked to lie down in a curled position on your left side.  Please inform the doctor if this is uncomfortable for you. You will be given oxygen to breathe. The test usually lasts 30 minutes to an hour. It may last longer if polyps need to be removed or if other abnormalities are noted.      You will be given medication  through the IV in order to control discomfort and help you relax.  You may sleep or be partially awake during the test. Sometimes, you might feel a cramping sensation. We will monitor you and try to make you as comfortable as possible. The tube will be inserted into your rectum (back side) and advanced through the large bowel.  The doctor will try and look at all of the inner walls of your colon. The outer wall and organs outside the colon are not visible with this test.    Possible Complications    Complications are unusual during or after the test but they can happen. The most common risks include colonic perforation (a tear in the colon), bleeding, respiratory problems, blood pressure problems, heart problems, discomfort and adverse reactions to the medications used.  A perforation may result in the need for emergency surgery and a colostomy bag. Also note, colonoscopy like other medical tests is not perfect. It may not detect problems such as polyps, cancers and other diseases up to 2 to 6% of the time. Luckily, the combined risk of all of these problems is small.    After the Test    You may feel bloated from air which was put into your colon during the test. You may also feel a little drowsy from the medications. You cannot drive or operate heavy machinery or do any important work for the rest of the day. You should plan on resting, watching TV or reading light material after  the test. You may forget things that happen during and directly after the test. It is important have someone with you that can remind you of any instructions we give.    Depending on what is found, you may need to have the colonoscopy repeated. Usually, this is done several years later but it may need to be done much sooner. Talk to your doctor about when you should have repeat study or other testing.        You will usually be at the hospital between 2-3 hours (although sometimes it can take longer).  We will make sure that you are  alright before sending you home. You must arrange for someone to drive you home after the test.  Once again, we will not perform the test unless you have an arranged ride. You cannot go home in a taxi or a bus.    Colonoscopy is a safe and effective test that is commonly done at our facilities. You may receive a call to remind you of the date and time of your test. If you have any questions, please feel free to call.     For questions about the test itself, call [phone].    For questions about the date and time of your test, or to change the date or time, call [phone]    For questions regarding your regular medications or health issues, please call your doctor.

## 2016-03-24 ENCOUNTER — Telehealth (HOSPITAL_BASED_OUTPATIENT_CLINIC_OR_DEPARTMENT_OTHER): Payer: Self-pay | Admitting: Ambulatory Care

## 2016-03-24 NOTE — Progress Notes (Signed)
Spoke with pt who was instructed by GI to ask PCP to let her know if she can stop the meds   ASA and HCTZ  Please advise on when she can stop each meds  ASA - ?5-7 days  HCTZ - am of preocedure    Please advise  Message sent to PCP  Linda Rodriguez J. Alaja Goldinger, RN, 03/24/2016, 3:08 PM

## 2016-03-24 NOTE — Progress Notes (Signed)
Reviewed questin - on ASA 81 mg - no need to stop this or  HCTZ unless instructed differently per Gastroenterology prior to her procedure    [name], MD, 03/24/2016, 11:14 AM

## 2016-03-24 NOTE — Progress Notes (Signed)
Pt is scheduled for a Colonoscopy 04/07/16   She will like to know if she can stop her Aspirin 81 mg and HCTZ and When?    Message sent to PCP, please advise  Raia Amico J. Arath Kaigler, RN, 03/24/2016, 9:55 AM

## 2016-03-24 NOTE — Progress Notes (Signed)
Spoke to pt who is at work and will call us back in a few mins  Await a call back  [employer]. Haig Prophet, RN, 03/24/2016, 9:36 AM

## 2016-03-24 NOTE — Progress Notes (Signed)
Call returned/placed to pt no answer, left message on VM to call clinic back at [place]. Haig Prophet, RN, 03/24/2016, 2:13 PM

## 2016-03-24 NOTE — Telephone Encounter (Signed)
-----   Message from Delene Ruffini sent at 03/24/2016  8:54 AM EST -----  Regarding: questions regarding colonoscopy and stopping medication  Contact: [phone]  SANTRICE GRIGNON EU:[id number], 66 year old, female    Calls today:  Clinical Questions ([place])    Specific nature of request questions regarding colonoscopy and stopping medication  Return phone number [phone]  Person calling on behalf of patient: Patient (self)    Patient's PCP: [name], MD

## 2016-03-26 ENCOUNTER — Telehealth (HOSPITAL_BASED_OUTPATIENT_CLINIC_OR_DEPARTMENT_OTHER): Payer: Self-pay | Admitting: Surgery

## 2016-03-26 NOTE — Progress Notes (Signed)
Linda Rodriguez, 03/26/2016, 3:26 PM    Called and spoke with patient to confirm colonoscopy with Dr. Mamie Levers on  Wednesday 04/07/2016 with an arrival time at 09:00 AM.    Offered to review the Colonoscopy prep instructions, patient verbalized understanding and stated that she has the hard copy of the Colonoscopy prep instructions.    Patient understands the need for ride after the procedure and has already picked up the medication from the pharmacy for Colonoscopy prep.    Advised patient to call the clinic for any questions or concerns.

## 2016-03-31 NOTE — Progress Notes (Signed)
Received a call back and spoke with pt  Relayed message from PCP  Stop/hold ASA 1 wk prior to procedure  Cont HCTZ including day of procedure    Pt verbalize understanding and agrees with plan  Callee Rohrig J. Jadasia Haws, RN, 03/31/2016, 12:42 PM

## 2016-03-31 NOTE — Progress Notes (Signed)
Call returned/placed to pt no answer, left message on VM to call clinic back at [place]. Lovena Kluck, RN, 03/31/2016, 12:35 PM    Linda Rodriguez   Caller: Unspecified (1 week ago, 9:34 AM)            Ah I see - to be cautious we can instruct her to:     1) STOP/HOLD ASA 1 week prior (approximately)   2) CONTINUE HCTZ including day of procedure     Thanks,     [facility]

## 2016-04-02 ENCOUNTER — Telehealth (HOSPITAL_BASED_OUTPATIENT_CLINIC_OR_DEPARTMENT_OTHER): Payer: Self-pay | Admitting: Surgery

## 2016-04-02 NOTE — Progress Notes (Signed)
Linda Rodriguez, 04/02/2016, 10:00 AM    Called and spoke with patient to confirm colonoscopy with Dr. Mamie Levers on  Wednesday 04/07/2016 with an arrival time at 09 AM.    Offered to review the Colonoscopy prep instructions, patient verbalized understanding of the instructions and stated that she has done the procedure four times in the past.    Patient stated that she will buy the medication from the Pharmacy as she is doing the Miralax and Dulcolax Bowel Prep.    Patient understands the need for ride after the procedure.     Patient was given the office number to call for any questions.

## 2016-04-07 ENCOUNTER — Ambulatory Visit (HOSPITAL_BASED_OUTPATIENT_CLINIC_OR_DEPARTMENT_OTHER)
Admit: 2016-04-07 | Disposition: A | Discharge status: Home / Self Care | Payer: Self-pay | Source: Ambulatory Visit | Attending: Surgery | Admitting: Surgery

## 2016-04-07 ENCOUNTER — Encounter (HOSPITAL_BASED_OUTPATIENT_CLINIC_OR_DEPARTMENT_OTHER): Payer: Self-pay | Admitting: Surgery

## 2016-04-07 MED ORDER — MIDAZOLAM HCL 2 MG/2ML IJ SOLN
Freq: Once | INTRAMUSCULAR | Status: AC | PRN
Start: 2016-04-07 — End: 2016-04-07
  Administered 2016-04-07: 1 mg via INTRAVENOUS

## 2016-04-07 MED ORDER — ONDANSETRON HCL 4 MG/2ML IJ SOLN
4.0000 mg | INTRAMUSCULAR | Status: DC | PRN
Start: 2016-04-07 — End: 2016-04-07

## 2016-04-07 MED ORDER — FENTANYL CITRATE 0.05 MG/ML IJ SOLN
Freq: Once | INTRAMUSCULAR | Status: AC | PRN
Start: 2016-04-07 — End: 2016-04-07
  Administered 2016-04-07: 25 ug via INTRAVENOUS

## 2016-04-07 MED ORDER — FENTANYL CITRATE 0.05 MG/ML IJ SOLN
INTRAMUSCULAR | Status: AC
Start: 2016-04-07 — End: 2016-04-07
  Filled 2016-04-07: qty 2

## 2016-04-07 MED ORDER — FENTANYL CITRATE 0.05 MG/ML IJ SOLN
Freq: Once | INTRAMUSCULAR | Status: AC | PRN
Start: 2016-04-07 — End: 2016-04-07
  Administered 2016-04-07: 50 ug via INTRAVENOUS

## 2016-04-07 MED ORDER — MIDAZOLAM HCL 2 MG/2ML IJ SOLN
Freq: Once | INTRAMUSCULAR | Status: AC | PRN
Start: 2016-04-07 — End: 2016-04-07
  Administered 2016-04-07: 2 mg via INTRAVENOUS

## 2016-04-07 MED ORDER — LACTATED RINGERS IV SOLN
INTRAVENOUS | Status: DC
Start: 2016-04-07 — End: 2016-04-07
  Administered 2016-04-07: 09:00:00 via INTRAVENOUS

## 2016-04-07 MED ORDER — MIDAZOLAM HCL 2 MG/2ML IJ SOLN
INTRAMUSCULAR | Status: AC
Start: 2016-04-07 — End: 2016-04-07
  Filled 2016-04-07: qty 4

## 2016-04-07 NOTE — H&P (Signed)
GI Pre-procedure History and Physical      Chief Complaint: She is here for Colonoscopy    HPI: Linda Rodriguez is an 66 year old female with history of HTN, MVR who presents today for her diagnostic colonoscopy. Her previous colonoscopy was done in 2013 where 1 polyp was removed that demonstrated colonic mucosa with focal surface hyperplastic change and prominent lymphoid aggregate. She states she has not experienced any changes in bowel habits in the last few years. She denies melena, BRBPR, abdominal pain, changes in appetite, unintentional weight loss.     Of note she does have a family history of colon cancer in her sister who was diagnosed with colon cancer in her 43s.     Active Problems:  Patient Active Problem List:     Mitral valve disorders     Calculus of kidney     Pure hypercholesterolemia     Family history of diabetes mellitus     Postmenopausal atrophic vaginitis     Generalized osteoarthrosis, involving hand     Allergic rhinitis due to pollen     Atypical chest pain     Vitamin D deficiency     Hyperopia     Blepharitis     Elevated hemoglobin A1c     Family history of malignant neoplasm of gastrointestinal tract     Basal cell carcinoma of nose     Right shoulder pain     Osteoporosis     Essential hypertension      Past Medical History:   Past Medical History:  08/10/2004: Calculus of kidney  09/10/2005: 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 Lorelle Formosa "ECC               inadequate but not suspicious clinically for                abnormality; follow one year pap as planned."                3/09 Pap insufficient cells, HPV negative 9/09                Pap normal HPV positive repeat colpo after                topical estrogens to facilitate ECC normal Pap                11/2008 WNL HPV negative, return to routine                screening 03/2010@ patient request HPV negative,               Pap no transformation zone, no rep  05/17/2011: Elevated blood  pressure reading without diagno*      Comment:  Most Recent BP Reading(s) 04/05/12 : 120/68                08/20/11 : 120/60 08/18/11 : 103/57    Patient                reports February BP readings 110/54, 114/70 and               states that since her life has become less                stressful, her BP decreased.   Most Recent BP                Reading(s)  01/27/11 : 148/72 04/08/10 : 118/60                05/02/09 : 112/64 11/20/08 : 110/68 10/30/08 :                112/60    08/10/2004: HYPERLIPIDEMIA NEC/NOS      Comment: LDL 194  08/10/2004: menopause 2001  08/10/2004: MITRAL VALVE DISORDER      Comment: History of rheumatic fever Echo 9/01 mild                mitral regurgitation  04/29/2009: Presbyopia    Past Surgical History:  Past Surgical History:  No date: BREAST REDUCTION SURGERY  1995: NIPPLE/AREOLA RECONSTRUCTION      Comment: breast reduction    Social History:    Social History  Social History   Marital status: Single  Spouse name: N/A    Years of education: N/A  Number of children: N/A     Occupational History  translator NONE      Social History Main Topics   Smoking status: Never Smoker    Smokeless tobacco: Never Used    Alcohol use Yes  0.0 oz/week     Comment: less than weekly    Drug use: No    Sexual activity: Not Currently    Partners: Male     Other Topics Concern    Military Service No    Blood Transfusions No    Caffeine Concern No    Occupational Exposure No    Sleep Concern No    Stress Concern No    Weight Concern No    Special Diet No    Exercise Yes    Comment: stopped zumba due to hip pain, plans to restart    Seat Belt Yes    Self-Exams Yes     Social History Narrative    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, no pets    Feels safe at home    [name], MD, 08/07/2015, 8:12 AM               Family History:    Family History    Heart Father     Comment: MI age 45    Osteoporosis Mother     Asthma Sister     Diabetes Brother     Theatre stage manager Abuse  Brother     Cataracts Father     Hypertension Sister     Hypertension Brother     Diabetes Brother     Cancer - Breast FamHxNeg     Cancer - Colon Sister     Diabetes Sister     Comment: sister required aortive valve replacement       Allergies:   Review of Patient's Allergies indicates:   Diclofenac potassium    Itching   Propoxyphene            Hives    Comment:After taking a propoxyphene/ASA combination             Tolerates Exedrin    Medications:     (Not in a hospital admission)    Review of Systems:  Review of Systems:  HEENT: No loose teeth, nosebleeds, glaucoma, cataracts    Cardiovascular:  No chest pain, palpitations, HTN, hypercholesterolemia, MI, heart surgery or heart failure.  Pulmonary:  No pneumonia, COPD, chronic cough,TB or asthma.    Neuro:  No stroke, seizure, migraines or  loss of consciousness.    Endocrine:  No diabetes or thyroid disease.    ID:  No TB, hepatitis A, hepatitis B, hepatitis C, HIV or rheumatic fever.    GU:  No hematuria, nephrolithiasis, kidney disease or bladder disease.  Heme/Onc:  No cancer, anemia or bleeding diathesis.   Derm:  No rash, hives, or shingles.    Psych: No anxiety disorder, depression, bullemia, anorexia nervosa, alcoholism or substance abuse  Musculoskeletal: No gout, arthritis, muscle disease or lupus.      Physical Exam:  Vital Signs:   General:AOx3, NAD  HEENT: Normocephalic, atrumatic, PERRLA, Extra occular motions intact. No scleral icterus. Pharynx benign. Tongue midline.  Airway Evaluation:  Gag reflex intact: Yes  Ability to open mouth wide:   Full  Dentures:  No  Loose teeth:  No  Neck range of motion  Full  Mallampati Airway Classification: Class I     A soft palate, fauces, uvula, anterior and posterior tonsil pillars are seen.  Pulmonary: Clear to auscultation and percussion. No rales, wheezes or ronchi.  Cardiovascular: S1, S2, no S3, S4, clicks, rubs or murmurs.  JVD not elevated with patient sitting.  Abdominal: Normal bowel sounds. No  tenderness on light or deep palpation. No hepatomegally or spleenomegally.  Extremities: Without clubbing, cyanosis or edema.   Neurological: No asterixis. Sensation intact. Cranial nerves II - XII grossly intact. Normal gait.   Derm: No jaundice, hives or rashes.    ASA Classification: ASA Class II (a patient with mild systemic disease)    Impression:  This is a 66 year old female who presents today for her colonoscopy. Her previous colonoscopy was done in 2013 where 1 polyp was removed. She has a positive family history of colon cancer in her sister (diagnosed at 74). Will plan for diagnostic colonoscopy and possible biopsy today.     Warner Mccreedy, PGY-2   General Surgery Resident

## 2016-04-07 NOTE — Discharge Instructions (Signed)
Moderate Conscious Sedation, Adult, Care After  Refer to this sheet in the next few weeks. These instructions provide you with information on caring for yourself after your procedure. Your health care provider may also give you more specific instructions. Your treatment has been planned according to current medical practices, but problems sometimes occur. Call your health care provider if you have any problems or questions after your procedure.  WHAT TO EXPECT AFTER THE PROCEDURE   After your procedure:   You may feel sleepy, clumsy, and have poor balance for several hours.   Vomiting may occur if you eat too soon after the procedure.  HOME CARE INSTRUCTIONS   Do not participate in any activities where you could become injured for at least 24 hours. Do not:    Drive.    Swim.    Ride a bicycle.    Operate heavy machinery.    Cook.    Use power tools.    Climb ladders.    Work from a high place.   Do not make important decisions or sign legal documents until you are improved.   If you vomit, drink water, juice, or soup when you can drink without vomiting. Make sure you have little or no nausea before eating solid foods.   Only take over-the-counter or prescription medicines for pain, discomfort, or fever as directed by your health care provider.   Make sure you and your family fully understand everything about the medicines given to you, including what side effects may occur.   You should not drink alcohol, take sleeping pills, or take medicines that cause drowsiness for at least 24 hours.   If you smoke, do not smoke without supervision.   If you are feeling better, you may resume normal activities 24 hours after you were sedated.   Keep all appointments with your health care provider.  SEEK MEDICAL CARE IF:   Your skin is pale or bluish in color.   You continue to feel nauseous or vomit.   Your pain is getting worse and is not helped by medicine.   You have bleeding or swelling.   You are still  sleepy or feeling clumsy after 24 hours.  SEEK IMMEDIATE MEDICAL CARE IF:   You develop a rash.   You have difficulty breathing.   You develop any type of allergic problem.   You have a fever.  MAKE SURE YOU:   Understand these instructions.   Will watch your condition.   Will get help right away if you are not doing well or get worse.     This information is not intended to replace advice given to you by your health care provider. Make sure you discuss any questions you have with your health care provider.     Document Released: 12/27/2012 Document Revised: 03/29/2014 Document Reviewed: 12/27/2012  Elsevier Interactive Patient Education 2016 Elsevier Inc[employer]  Colonoscopy, Care After  Refer to this sheet in the next few weeks. These instructions provide you with information on caring for yourself after your procedure. Your health care provider may also give you more specific instructions. Your treatment has been planned according to current medical practices, but problems sometimes occur. Call your health care provider if you have any problems or questions after your procedure.  WHAT TO EXPECT AFTER THE PROCEDURE   After your procedure, it is typical to have the following:   A small amount of blood in your stool.   Moderate amounts of gas and mild abdominal  cramping or bloating.  HOME CARE INSTRUCTIONS   Do not drive, operate machinery, or sign important documents for 24 hours.   You may shower and resume your regular physical activities, but move at a slower pace for the first 24 hours.   Take frequent rest periods for the first 24 hours.   Walk around or put a warm pack on your abdomen to help reduce abdominal cramping and bloating.   Drink enough fluids to keep your urine clear or pale yellow.   You may resume your normal diet as instructed by your health care provider. Avoid heavy or fried foods that are hard to digest.   Avoid drinking alcohol for 24 hours or as instructed by your health care  provider.   Only take over-the-counter or prescription medicines as directed by your health care provider.   If a tissue sample (biopsy) was taken during your procedure:    Do not take aspirin or blood thinners for 7 days, or as instructed by your health care provider.    Do not drink alcohol for 7 days, or as instructed by your health care provider.    Eat soft foods for the first 24 hours.  SEEK MEDICAL CARE IF:  You have persistent spotting of blood in your stool 2-3 days after the procedure.  SEEK IMMEDIATE MEDICAL CARE IF:   You have more than a small spotting of blood in your stool.   You pass large blood clots in your stool.   Your abdomen is swollen (distended).   You have nausea or vomiting.   You have a fever.   You have increasing abdominal pain that is not relieved with medicine.     This information is not intended to replace advice given to you by your health care provider. Make sure you discuss any questions you have with your health care provider.     Document Released: 10/21/2003 Document Revised: 12/27/2012 Document Reviewed: 11/13/2012  Elsevier Interactive Patient Education 2016 Elsevier Inc.              Colon Polyps  Polyps are lumps of extra tissue growing inside the body. Polyps can grow in the large intestine (colon). Most colon polyps are noncancerous (benign). However, some colon polyps can become cancerous over time. Polyps that are larger than a pea may be harmful. To be safe, caregivers remove and test all polyps.  CAUSES   Polyps form when mutations in the genes cause your cells to grow and divide even though no more tissue is needed.  RISK FACTORS  There are a number of risk factors that can increase your chances of getting colon polyps. They include:   Being older than 50 years.   Family history of colon polyps or colon cancer.   Long-term colon diseases, such as colitis or Crohn disease.   Being overweight.   Smoking.   Being inactive.   Drinking too much  alcohol.  SYMPTOMS   Most small polyps do not cause symptoms. If symptoms are present, they may include:   Blood in the stool. The stool may look dark red or black.   Constipation or diarrhea that lasts longer than 1 week.  DIAGNOSIS  People often do not know they have polyps until their caregiver finds them during a regular checkup. Your caregiver can use 4 tests to check for polyps:   Digital rectal exam. The caregiver wears gloves and feels inside the rectum. This test would find polyps only in the rectum.  Barium enema. The caregiver puts a liquid called barium into your rectum before taking X-rays of your colon. Barium makes your colon look white. Polyps are dark, so they are easy to see in the X-ray pictures.   Sigmoidoscopy. A thin, flexible tube (sigmoidoscope) is placed into your rectum. The sigmoidoscope has a light and tiny camera in it. The caregiver uses the sigmoidoscope to look at the last third of your colon.   Colonoscopy. This test is like sigmoidoscopy, but the caregiver looks at the entire colon. This is the most common method for finding and removing polyps.  TREATMENT   Any polyps will be removed during a sigmoidoscopy or colonoscopy. The polyps are then tested for cancer.  PREVENTION   To help lower your risk of getting more colon polyps:   Eat plenty of fruits and vegetables. Avoid eating fatty foods.   Do not smoke.   Avoid drinking alcohol.   Exercise every day.   Lose weight if recommended by your caregiver.   Eat plenty of calcium and folate. Foods that are rich in calcium include milk, cheese, and broccoli. Foods that are rich in folate include chickpeas, kidney beans, and spinach.  HOME CARE INSTRUCTIONS  Keep all follow-up appointments as directed by your caregiver. You may need periodic exams to check for polyps.  SEEK MEDICAL CARE IF:  You notice bleeding during a bowel movement.     This information is not intended to replace advice given to you by your health care  provider. Make sure you discuss any questions you have with your health care provider.     Document Released: 12/03/2003 Document Revised: 03/29/2014 Document Reviewed: 05/18/2011  Elsevier Interactive Patient Education 2016 Elsevier Inc.      Colonoscopy, Care After  Refer to this sheet in the next few weeks. These instructions provide you with information on caring for yourself after your procedure. Your health care provider may also give you more specific instructions. Your treatment has been planned according to current medical practices, but problems sometimes occur. Call your health care provider if you have any problems or questions after your procedure.  WHAT TO EXPECT AFTER THE PROCEDURE   After your procedure, it is typical to have the following:   A small amount of blood in your stool.   Moderate amounts of gas and mild abdominal cramping or bloating.  HOME CARE INSTRUCTIONS   Do not drive, operate machinery, or sign important documents for 24 hours.   You may shower and resume your regular physical activities, but move at a slower pace for the first 24 hours.   Take frequent rest periods for the first 24 hours.   Walk around or put a warm pack on your abdomen to help reduce abdominal cramping and bloating.   Drink enough fluids to keep your urine clear or pale yellow.   You may resume your normal diet as instructed by your health care provider. Avoid heavy or fried foods that are hard to digest.   Avoid drinking alcohol for 24 hours or as instructed by your health care provider.   Only take over-the-counter or prescription medicines as directed by your health care provider.   If a tissue sample (biopsy) was taken during your procedure:    Do not take aspirin or blood thinners for 7 days, or as instructed by your health care provider.    Do not drink alcohol for 7 days, or as instructed by your health care provider.    Eat  soft foods for the first 24 hours.  SEEK MEDICAL CARE IF:  You have  persistent spotting of blood in your stool 2-3 days after the procedure.  SEEK IMMEDIATE MEDICAL CARE IF:   You have more than a small spotting of blood in your stool.   You pass large blood clots in your stool.   Your abdomen is swollen (distended).   You have nausea or vomiting.   You have a fever.   You have increasing abdominal pain that is not relieved with medicine.     This information is not intended to replace advice given to you by your health care provider. Make sure you discuss any questions you have with your health care provider.     Document Released: 10/21/2003 Document Revised: 12/27/2012 Document Reviewed: 11/13/2012  Elsevier Interactive Patient Education [employer].

## 2016-04-07 NOTE — PROVATION-GI (Signed)
[facility]  Patient Name: Linda Rodriguez  MRN: [id number]  Account Number: [id number]  Gender: Female  Age: 66  Date of Birth: 1950/12/31  Admit Type: Outpatient  Patient Location: [facility]  Note Status: Finalized  Referring MD:         [name], MD  Procedure Date:       04/07/2016 9:32:20 AM  Procedure:            Colonoscopy  Endoscopist:          Rahmon Heigl K. Mamie Levers, MD, Warner Mccreedy MD surg res  Indications for Procedure:       Screening in patient at increased risk: Family history of 1st-degree relative        with colorectal cancer, High risk colon cancer surveillance: Personal history        of colonic polyps  Medications:          Midazolam 4 mg IV, Fentanyl 100 micrograms IV  Procedure:       Just prior to the procedure, an updated history and physical was done. I        obtained an informed consent from the patient reviewing the risk of the        procedure including (but not limited to) respiratory depression, perforation,        bleeding, discomfort, a possible need for surgery and unexpected reactions to        medications. The patient is aware that test has limitations and may not        detect significant lesions such as cancer or other potential diseases. The        patient was also informed that they might need a repeat colonoscopy earlier        than standard guidelines if there are changes in their symptoms or concerning        findings noted. A time out was performed with the entire procedure staff        present. The scope was passed under direct vision. Throughout the procedure,        the patient's blood pressure, pulse, and oxygen saturations were monitored        continuously. The [id number] was introduced through the anus and        advanced to the terminal ileum. The scope was then slowly withdrawn with        confirmation of the noted findings. The colonoscopy was performed without        difficulty. The patient tolerated the procedure well. The quality of the         bowel preparation was good. Scope withdrawal time was 18 minutes.  Findings:       The perianal and digital rectal examinations were normal.       A 1 mm polyp was found in the descending colon. The polyp was removed with a        cold biopsy forceps. Resection and retrieval were complete. Estimated blood        loss: none.       A 2 mm polyp was found in the sigmoid colon. The polyp was removed with a        cold biopsy forceps. Resection and retrieval were complete. Estimated blood        loss: none.       Internal hemorrhoids were found during retroflexion. The hemorrhoids were        mild.       The exam  was otherwise normal throughout the examined colon.  Post Procedure Diagnosis:       - One 1 mm polyp in the descending colon, removed with a cold biopsy forceps.        Resected and retrieved.       - One 2 mm polyp in the sigmoid colon, removed with a cold biopsy forceps.        Resected and retrieved.       - Internal hemorrhoids.  Complications:        No immediate complications. Estimated blood loss: None.  Recommendation:       - Patient has a contact number available for emergencies. The signs and        symptoms of potential delayed complications were discussed with the patient.        Return to normal activities tomorrow. Written discharge instructions were        provided to the patient.       - Repeat colonoscopy in 5 years for surveillance.       - Return to primary care physician PRN.  Theora Gianotti, MD  04/07/2016 10:42:25 AM  This report has been signed electronically.  Number of Addenda: 0  Note Initiated On: 04/07/2016 9:32 AM

## 2016-04-09 LAB — SURGICAL PATH SPECIMEN

## 2016-04-23 ENCOUNTER — Ambulatory Visit (HOSPITAL_BASED_OUTPATIENT_CLINIC_OR_DEPARTMENT_OTHER): Payer: No Typology Code available for payment source | Admitting: Surgery

## 2016-05-12 ENCOUNTER — Encounter (HOSPITAL_BASED_OUTPATIENT_CLINIC_OR_DEPARTMENT_OTHER): Payer: Self-pay | Admitting: Internal Medicine

## 2016-05-14 ENCOUNTER — Telehealth (HOSPITAL_BASED_OUTPATIENT_CLINIC_OR_DEPARTMENT_OTHER): Payer: Self-pay | Admitting: Ambulatory Care

## 2016-05-14 MED ORDER — ZOLPIDEM TARTRATE 5 MG PO TABS
5.00 mg | ORAL_TABLET | ORAL | 0 refills | Status: AC | PRN
Start: 2016-05-14 — End: 2016-05-28

## 2016-05-14 MED ORDER — ZOLPIDEM TARTRATE 5 MG PO TABS: 5 mg | tablet | ORAL | 0 refills | 0 days | Status: AC | PRN

## 2016-05-14 NOTE — Telephone Encounter (Signed)
Information sent to PCP in a Tel encounter  [place]. Rashied Corallo, RN, 05/14/2016, 9:11 AM

## 2016-05-14 NOTE — Progress Notes (Signed)
Spoke to pt who will be leaving [facility] 2/28 and moving back to [place]  She will like to have a sleep medicine ordered for her flight which is a long one    She said on her last flight she was given a chewable Zolpiden 5 mg under the tongue by her niece and she slept the entire 6 hrs from [place] to [place] which is half the trip    Message sent to PCP  Pt is requesting 1 pill but if she can have more that would be helpful    Please advise.  Sylus Stgermain J. Haig Prophet, RN, 05/14/2016, 8:44 AM

## 2016-05-14 NOTE — Progress Notes (Signed)
Patient prescribed Ambien as requested - all available formulations of Ambien are oral only (not sublingual)  Sent #4 tablets to preferred pharmacy on file    [name], MD, 05/14/2016, 8:16 PM

## 2016-05-14 NOTE — Telephone Encounter (Signed)
-----   Message from [facility] sent at 05/13/2016  3:54 PM EST -----  Regarding: medication refill  Contact: [phone]  SIMI BERMUDES [id number], 66 year old, female    Calls today:  Clinical Questions (NON-SICK CLINICAL QUESTIONS ONLY)      Specific nature of request As you know, I am moving back to [place] and my last day here at [facility] is on 05/19/16, but before loosing my insurance, I would like to request you a medication to help me to sleep on my trip. 11 hours flying. My nice had given to me 1 pill of Zolpiden 5mg  to put under the tongue when I was flying from [facility] to [place] in the middle of last year. I slept as a baby. Can you send to the [facility] a  prescription of it?   Return phone number [phone]  Person calling on behalf of patient: Patient (self)    Patient's language of care: English    Patient does not need an interpreter.    Patient's PCP: [name], MD

## 2016-05-31 MED FILL — FENOFIBRATE 160MG: 30 days supply | Qty: 30 | Fill #0 | Status: CP

## 2016-05-31 MED FILL — *VITAMIN D3 2000UNIT: 30 days supply | Qty: 30 | Fill #0 | Status: CP

## 2016-05-31 MED FILL — ATORVASTATIN 10MG: 15 days supply | Qty: 15 | Fill #0 | Status: CP

## 2016-05-31 MED FILL — *HYDROCHLOROT 12.5MG CAPS: 90 days supply | Qty: 90 | Fill #0 | Status: CP

## 2016-05-31 MED FILL — FAMOTIDINE 20MG: 30 days supply | Qty: 60 | Fill #0 | Status: CP

## 2022-06-08 IMAGING — DX MEMBROS SUPERIORES
1 series · 1 of 1 positions shown · non-contrast
Comparison: none

[AP]
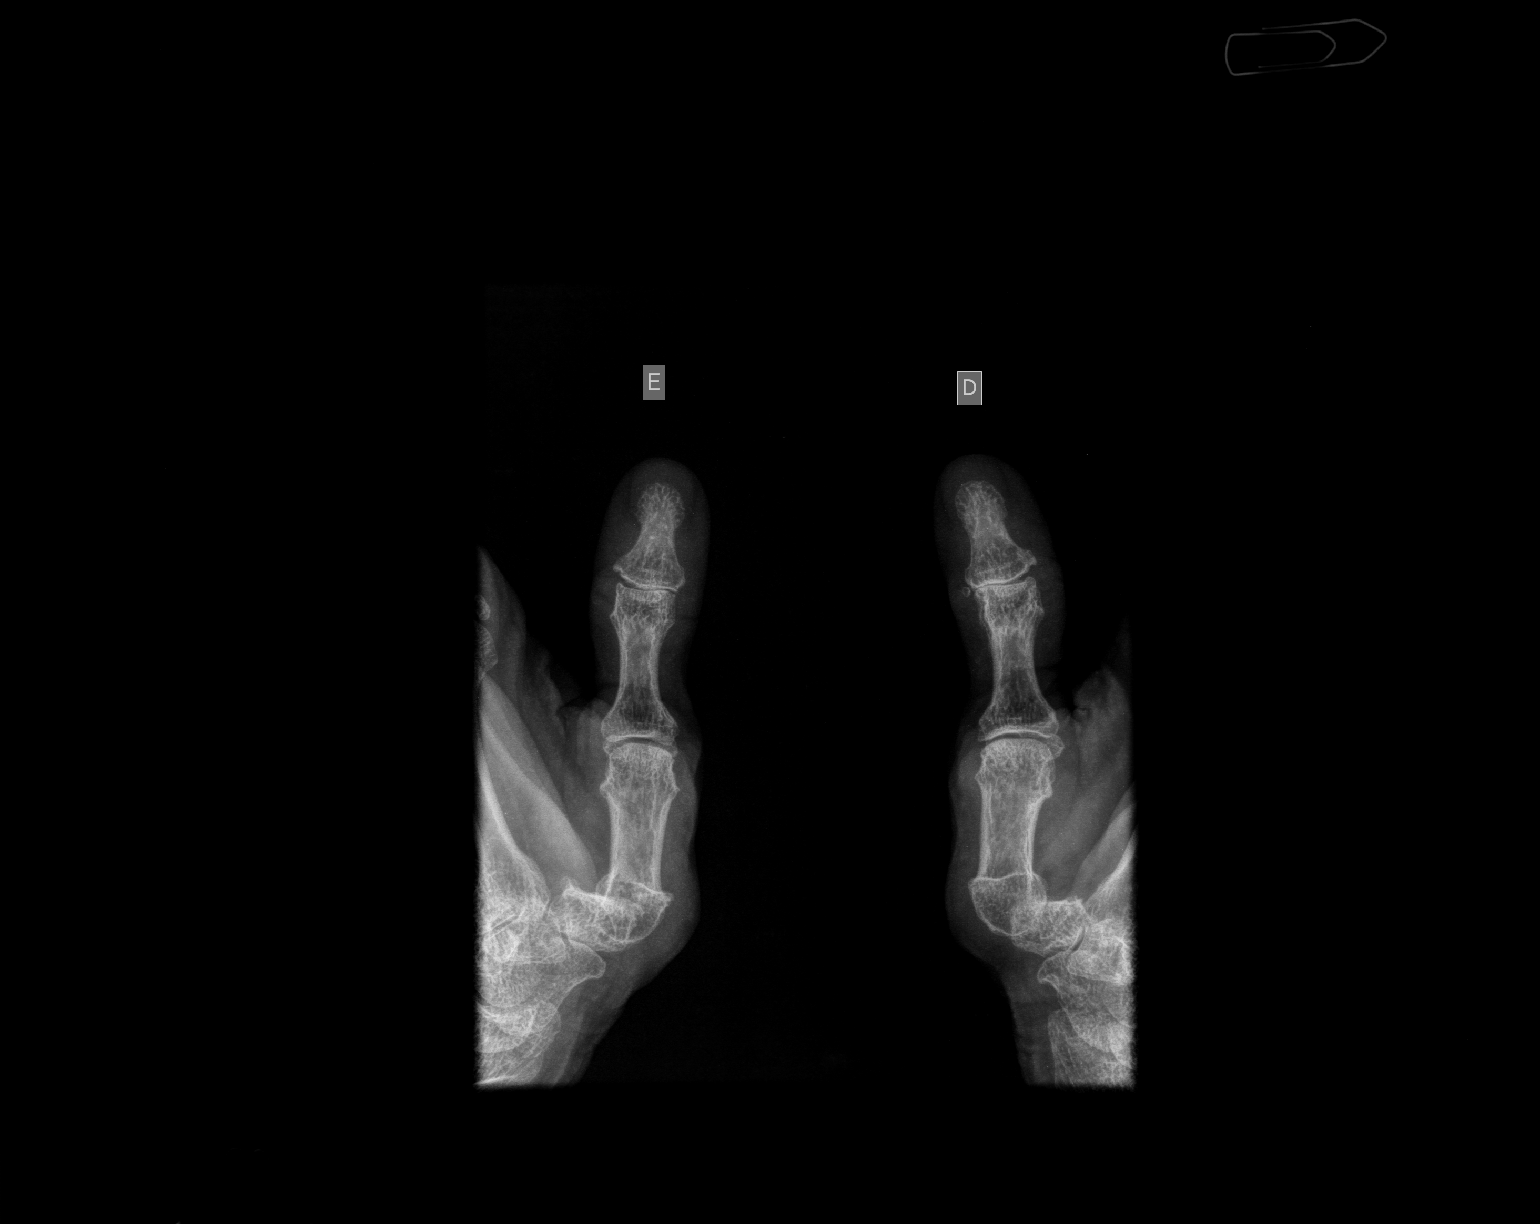

[1 of 1 positions shown; findings below may reference images not displayed]

Relatório:
Redução do espaço da articulação interfalangeana com incipientes osteófitos marginais.
Sinais de desmineralização óssea.
RADIOGRAFIA DIGITAL DA MÃO DIREITA PA E PERFIL
Demais espaços e superfícies articulares preservados.
Partes moles visíveis sem alterações.
[facility] - [address] [place] [id number], [place]

## 2023-06-16 ENCOUNTER — Other Ambulatory Visit: Payer: Self-pay | Admitting: Internal Medicine

## 2024-01-27 IMAGING — MR RM - ARTICULAR TORNOZELO ESQUERDO
4 of 6 series · 19 of 40 positions shown · non-contrast
Comparison: none

[PD fat-sat · sagittal · 3.0mm · 0.29mm/px · 6 of 19 slices shown (1 of 2)]
[im 1/19]
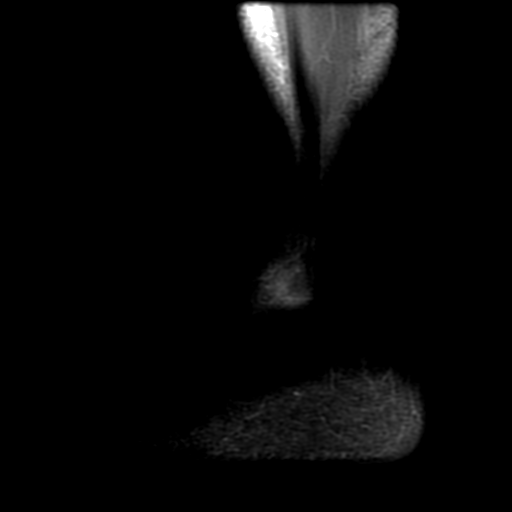
[im 4/19]
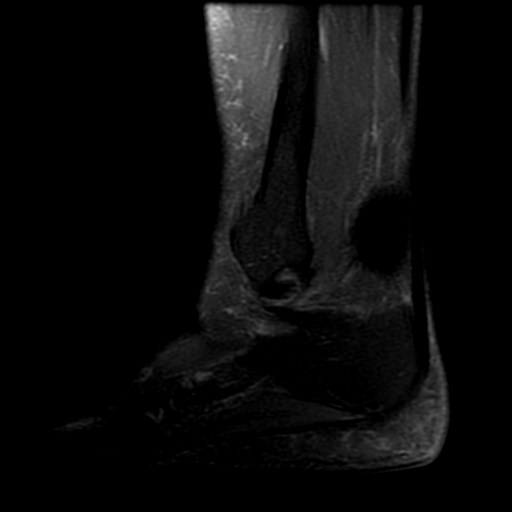
[im 8/19]
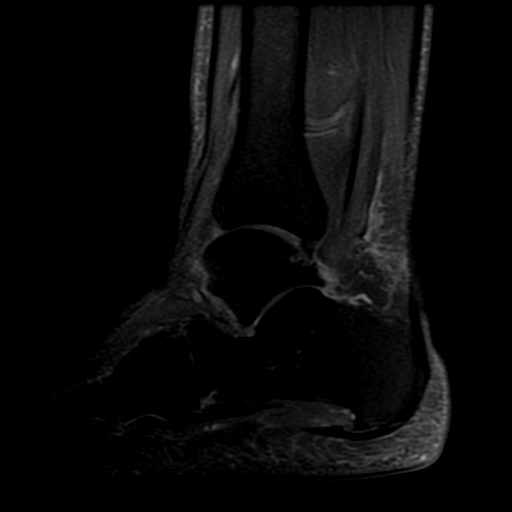
[im 11/19]
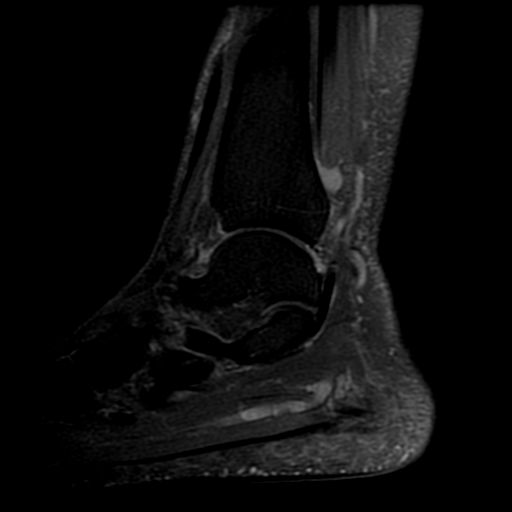
[im 15/19]
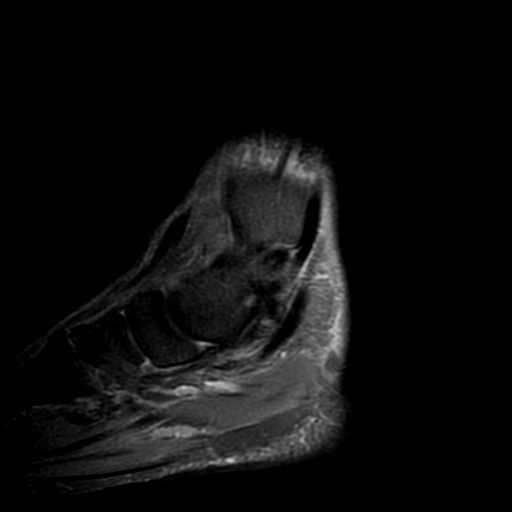
[im 19/19]
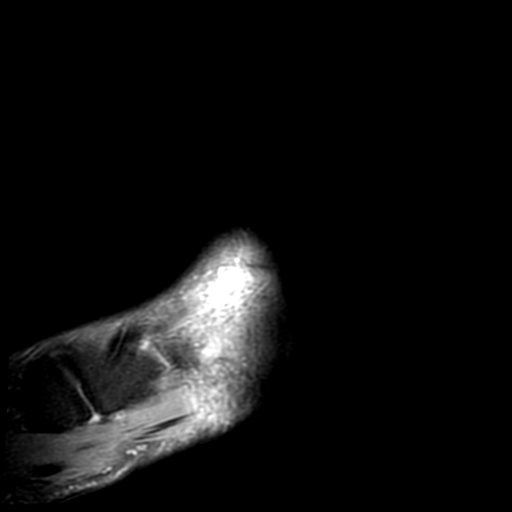

[PD · coronal · 4.0mm · 0.29mm/px · 3 of 25 slices shown (1 of 2)]
[im 4/25]
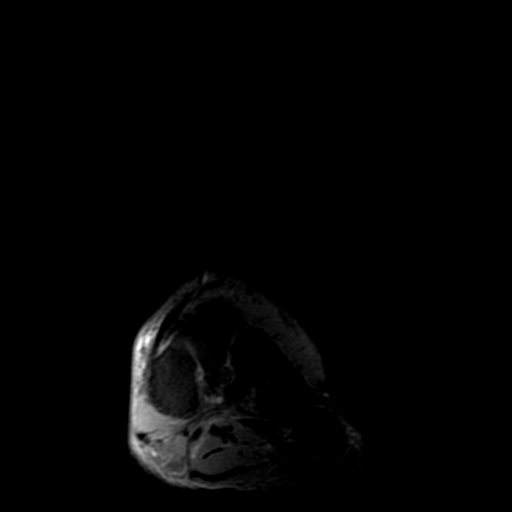
[im 14/25]
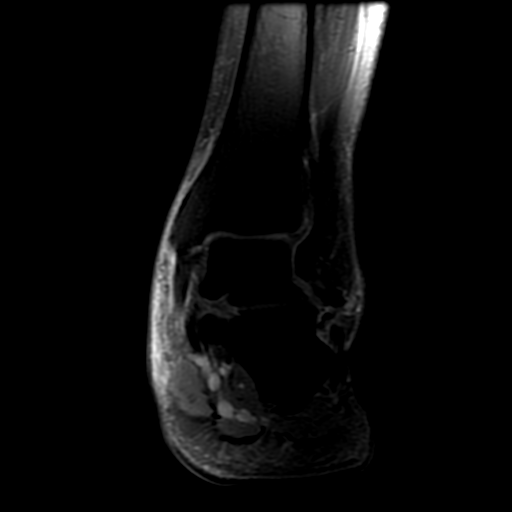
[im 21/25]
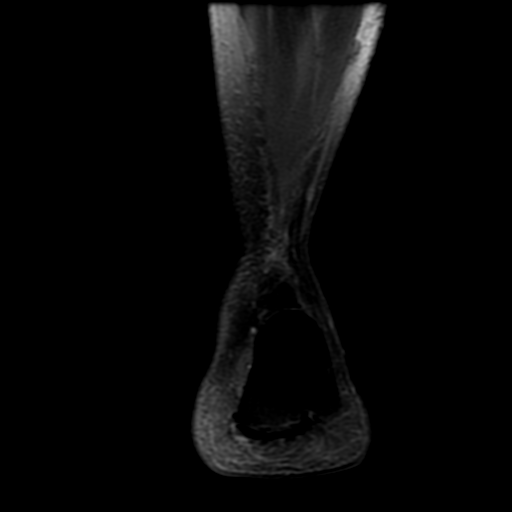

[PD fat-sat · axial · 3.0mm · 0.29mm/px · z∈[-59,+17]mm · 7 of 28 slices shown (2 of 2)]
[im 1/28]
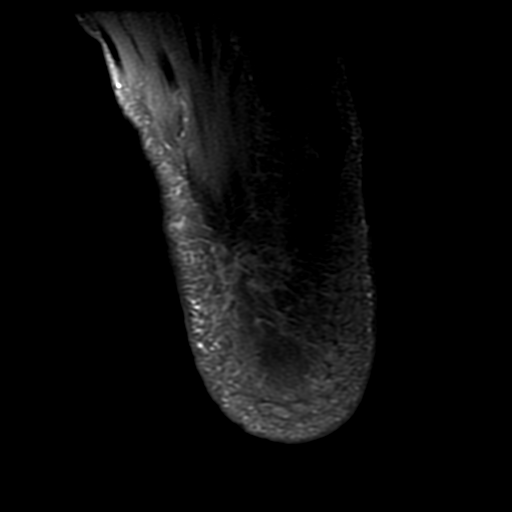
[im 4/28]
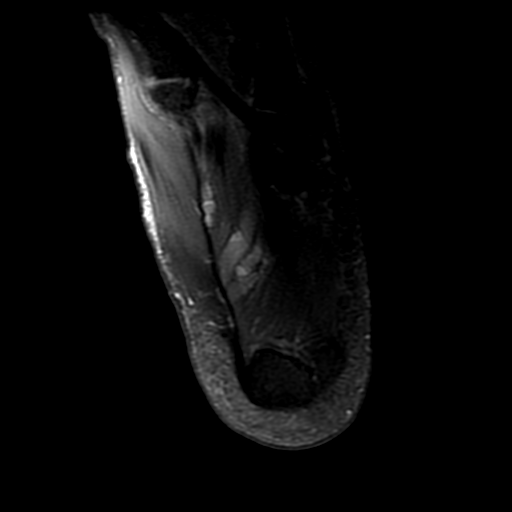
[im 8/28]
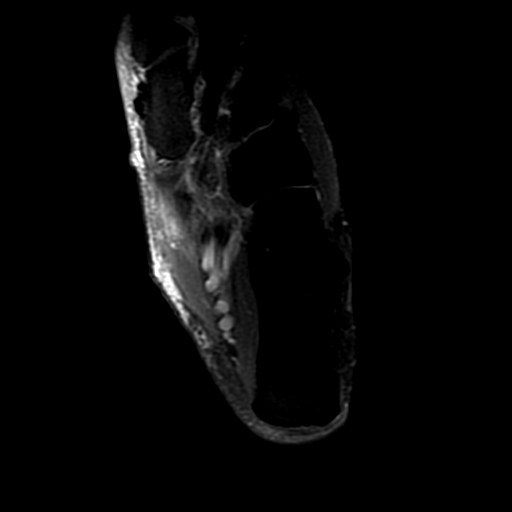
[im 12/28]
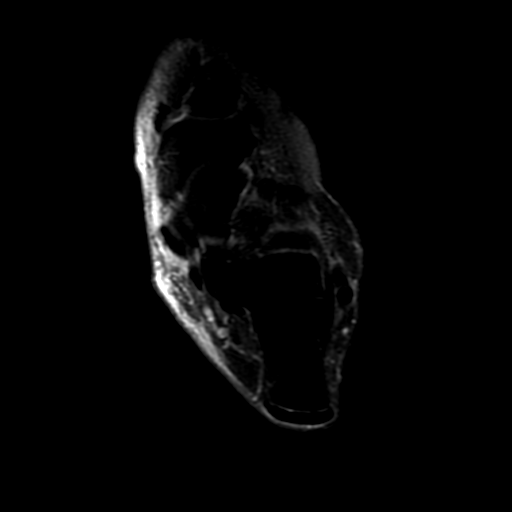
[im 16/28]
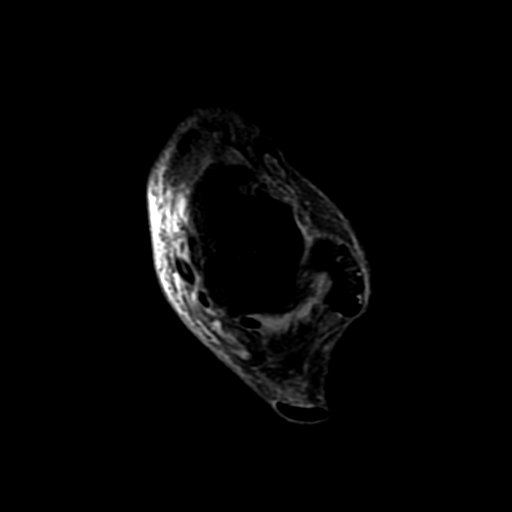
[im 20/28]
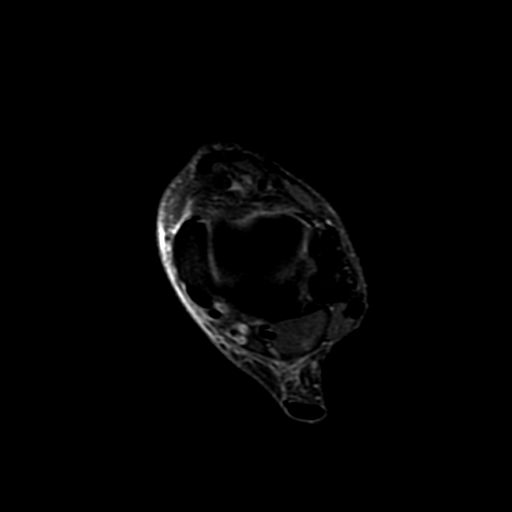
[im 24/28]
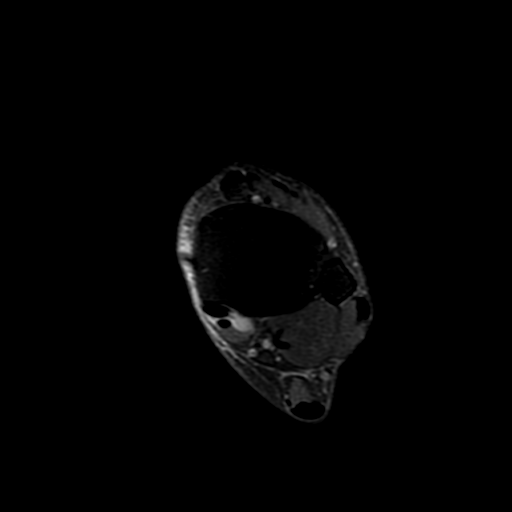

[PD · axial · 3.0mm · 0.29mm/px · z∈[-49,+17]mm · 3 of 28 slices shown (2 of 2)]
[im 4/28]
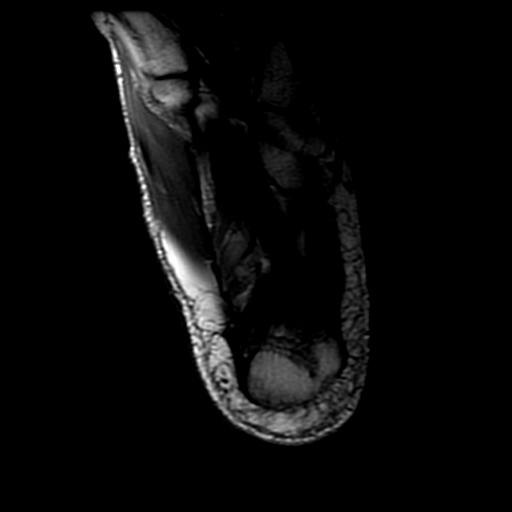
[im 16/28]
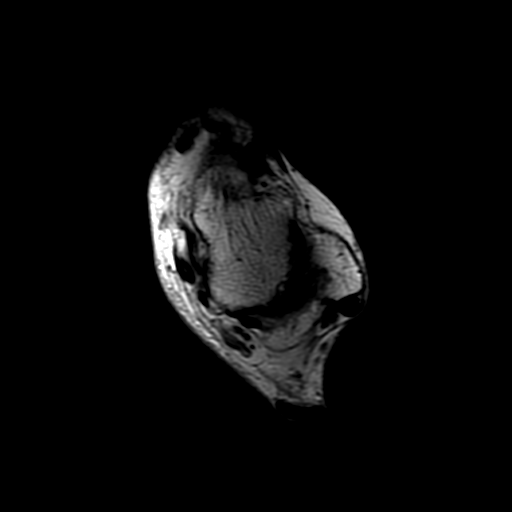
[im 24/28]
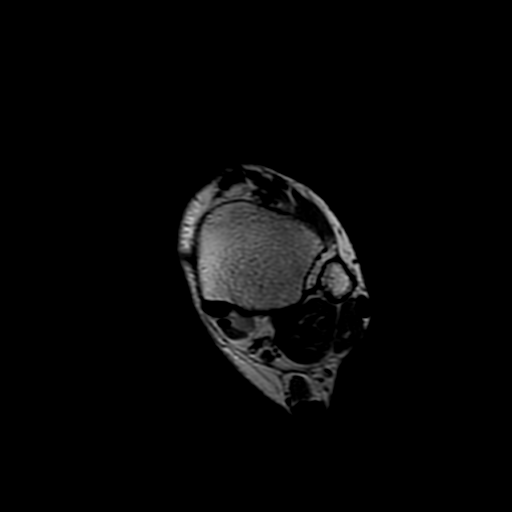

[19 of 40 positions shown; findings below may reference images not displayed]

Informação adicional: -
Médico: -
Técnica:
Foram realizadas sequências multiplanares com ponderações em T1 e T2/DP, com e sem supressão do sinal da
gordura.
Relatório:
Edema em tecidos moles nas regiões perimaleolares e dorsal do mediopé.
Aumento do sinal dos ligamentos taloﬁbular anterior, calcaneoﬁbular e deltoide, com orientação espacial preservada.
RESSONÂNCIA MAGNÉTICA DO TORNOZELO ESQUERDO
Demais ligamentos tem orientação habitual e intensidade de sinal normal.
A sindesmose está íntegra.
Edema no peritenon e gordura pré-aquiliana.
Espessamento e aumento do sinal do tendão tibial posterior, com ﬁssuras intrassubstanciais, sem sinais de perfuração,
associado a edema em tecidos moles circunjacentes e distensão líquida da bainha sinovial.
Discreto espessamento e aumento do sinal dos tendões ﬁbulares, sem sinais de perfuração, com distensão líquida das
bainhas sinoviais.
Demais tendões estão preservados.
Espessamento da porção central da fáscia plantar, sem sinais de perfuração, associada a entesóﬁto plantar.
Espaços articulares não apresentam anormalidades signiﬁcativas.
Ausência de derrame articular. 
Neste estudo sem carga nota-se desvio plantar do eixo do tálus, inferindo sinais de pé plano.
Impressão diagnóstica:
Degeneração e/ou estiramento crônico dos ligamentos calcaneoﬁbular, taloﬁbular anterior e deltoide.
Tendinopatia com ﬁssuras do tibial posterior, associado a sinais de tenossinovite e peritendinite.
Neste estudo sem carga há sinais de pé plano.
Tendinopatia e tenossinovite dos ﬁbulares.
[facility] - [address] [place] [id number], [place]
Informação adicional: -
Médico: -
Peri e paratendinite do [place].
Alterações degenerativas na fáscia plantar.

[facility] - [address] [place] [id number], [place]
